# Patient Record
Sex: Female | Born: 1965 | Hispanic: No | Smoking: Never smoker
Health system: Southern US, Community
[De-identification: ages and names within clinical notes are randomized; demographics above are authoritative.]

## PROBLEM LIST (undated history)

## (undated) DIAGNOSIS — I10 Essential (primary) hypertension: Secondary | ICD-10-CM

## (undated) DIAGNOSIS — L309 Dermatitis, unspecified: Secondary | ICD-10-CM

## (undated) DIAGNOSIS — S069X9A Unspecified intracranial injury with loss of consciousness of unspecified duration, initial encounter: Secondary | ICD-10-CM

## (undated) DIAGNOSIS — S069XAA Unspecified intracranial injury with loss of consciousness status unknown, initial encounter: Secondary | ICD-10-CM

## (undated) HISTORY — DX: Essential (primary) hypertension: I10

## (undated) HISTORY — DX: Dermatitis, unspecified: L30.9

## (undated) HISTORY — DX: Unspecified intracranial injury with loss of consciousness status unknown, initial encounter: S06.9XAA

## (undated) HISTORY — DX: Unspecified intracranial injury with loss of consciousness of unspecified duration, initial encounter: S06.9X9A

---

## 1985-12-02 HISTORY — PX: FOREARM SURGERY: SHX651

## 2005-01-21 ENCOUNTER — Other Ambulatory Visit: Admission: RE | Admit: 2005-01-21 | Discharge: 2005-01-21 | Payer: Self-pay | Admitting: Family Medicine

## 2005-04-22 ENCOUNTER — Encounter: Admission: RE | Admit: 2005-04-22 | Discharge: 2005-04-22 | Payer: Self-pay | Admitting: Family Medicine

## 2009-06-15 ENCOUNTER — Other Ambulatory Visit: Admission: RE | Admit: 2009-06-15 | Discharge: 2009-06-15 | Payer: Self-pay | Admitting: Family Medicine

## 2010-08-17 ENCOUNTER — Other Ambulatory Visit: Admission: RE | Admit: 2010-08-17 | Discharge: 2010-08-17 | Payer: Self-pay | Admitting: Family Medicine

## 2010-11-29 ENCOUNTER — Encounter
Admission: RE | Admit: 2010-11-29 | Discharge: 2010-11-29 | Payer: Self-pay | Source: Home / Self Care | Attending: Family Medicine | Admitting: Family Medicine

## 2010-12-23 ENCOUNTER — Encounter: Payer: Self-pay | Admitting: Family Medicine

## 2010-12-24 ENCOUNTER — Encounter: Payer: Self-pay | Admitting: Family Medicine

## 2012-06-08 ENCOUNTER — Other Ambulatory Visit: Payer: Self-pay | Admitting: Family Medicine

## 2012-06-08 ENCOUNTER — Other Ambulatory Visit (HOSPITAL_COMMUNITY)
Admission: RE | Admit: 2012-06-08 | Discharge: 2012-06-08 | Disposition: A | Discharge status: Home / Self Care | Payer: BC Managed Care – PPO | Source: Ambulatory Visit | Attending: Family Medicine | Admitting: Family Medicine

## 2012-06-08 DIAGNOSIS — Z124 Encounter for screening for malignant neoplasm of cervix: Secondary | ICD-10-CM | POA: Insufficient documentation

## 2013-01-29 ENCOUNTER — Other Ambulatory Visit: Payer: Self-pay

## 2013-01-29 DIAGNOSIS — Z1231 Encounter for screening mammogram for malignant neoplasm of breast: Secondary | ICD-10-CM

## 2013-03-19 ENCOUNTER — Ambulatory Visit
Admission: RE | Admit: 2013-03-19 | Discharge: 2013-03-19 | Disposition: A | Discharge status: Home / Self Care | Payer: BC Managed Care – PPO | Source: Ambulatory Visit

## 2013-03-19 DIAGNOSIS — Z1231 Encounter for screening mammogram for malignant neoplasm of breast: Secondary | ICD-10-CM

## 2014-04-11 IMAGING — MG MM DIGITAL SCREENING BILAT
4 series · 4 of 4 positions shown · non-contrast
Comparison: 11/29/2010

CLINICAL DATA: Screening.

DIGITAL BILATERAL SCREENING MAMMOGRAM WITH CAD

[L CC]
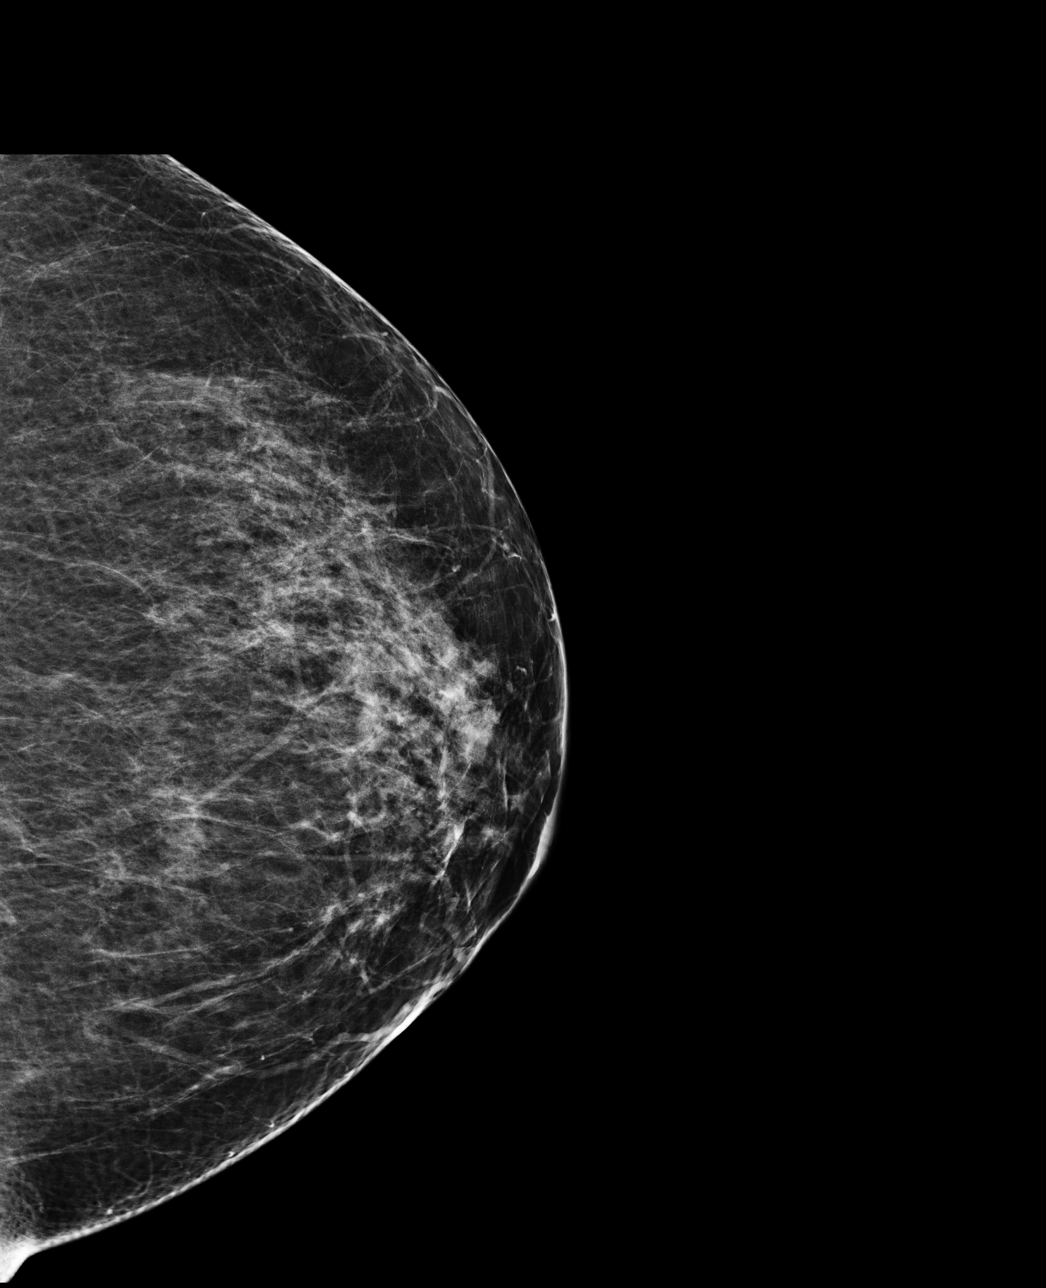

[L MLO]
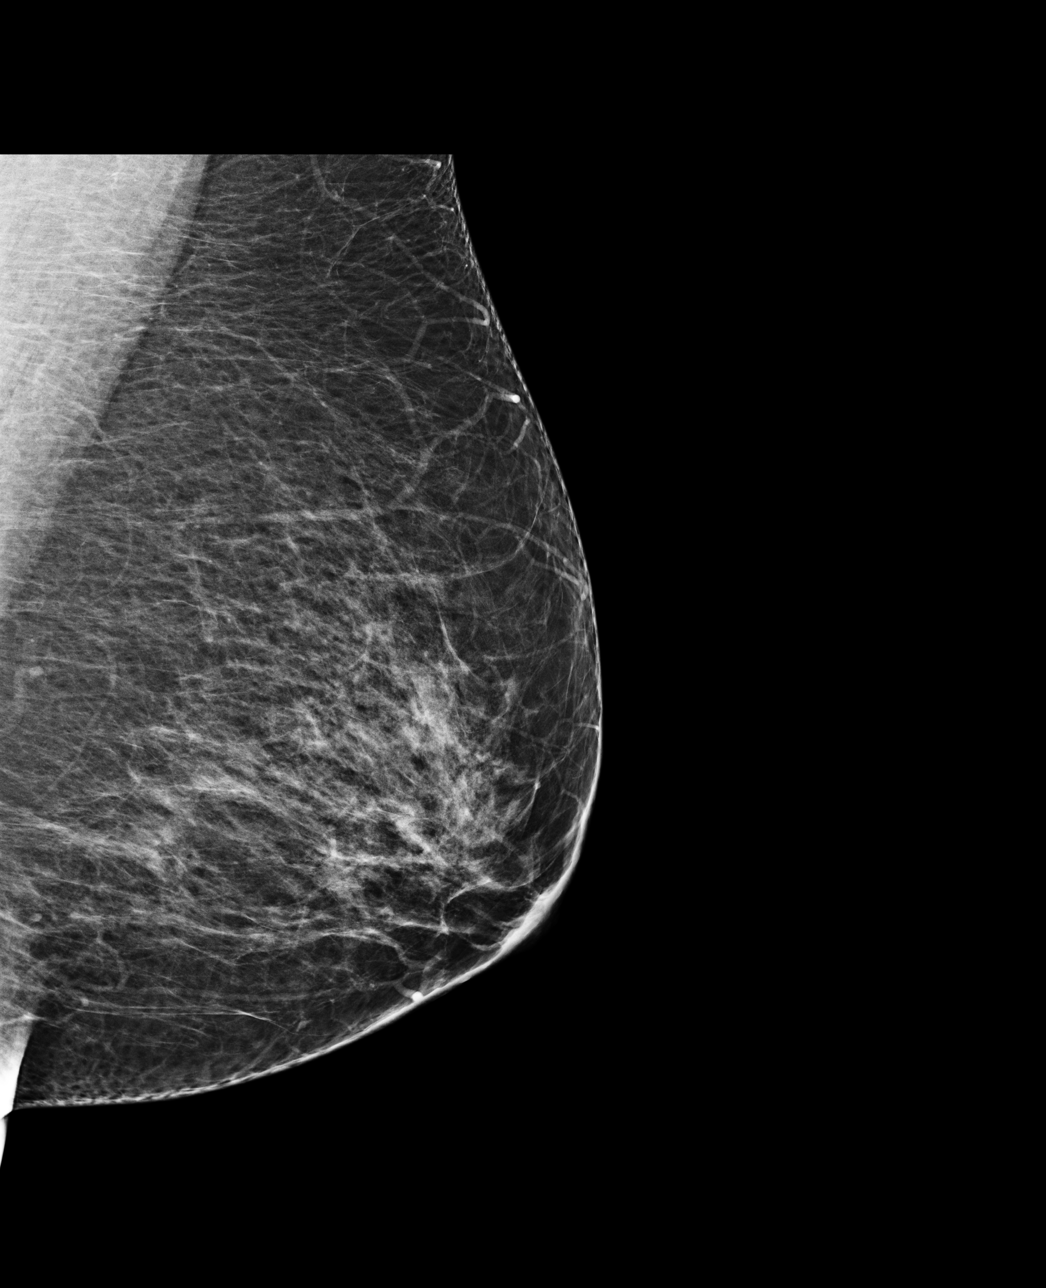

[R MLO]
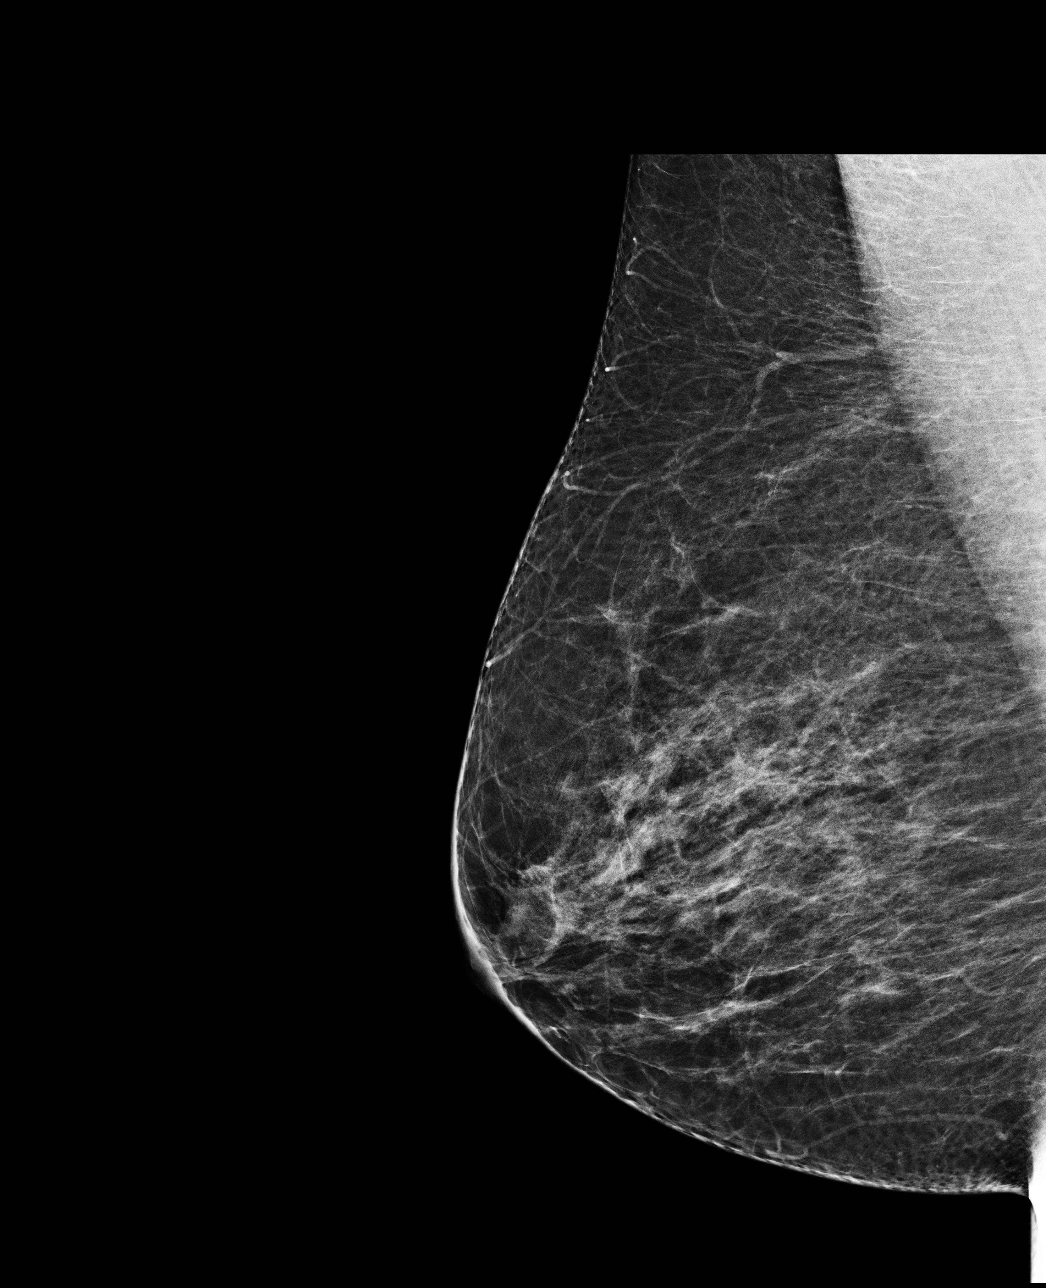

[R CC]
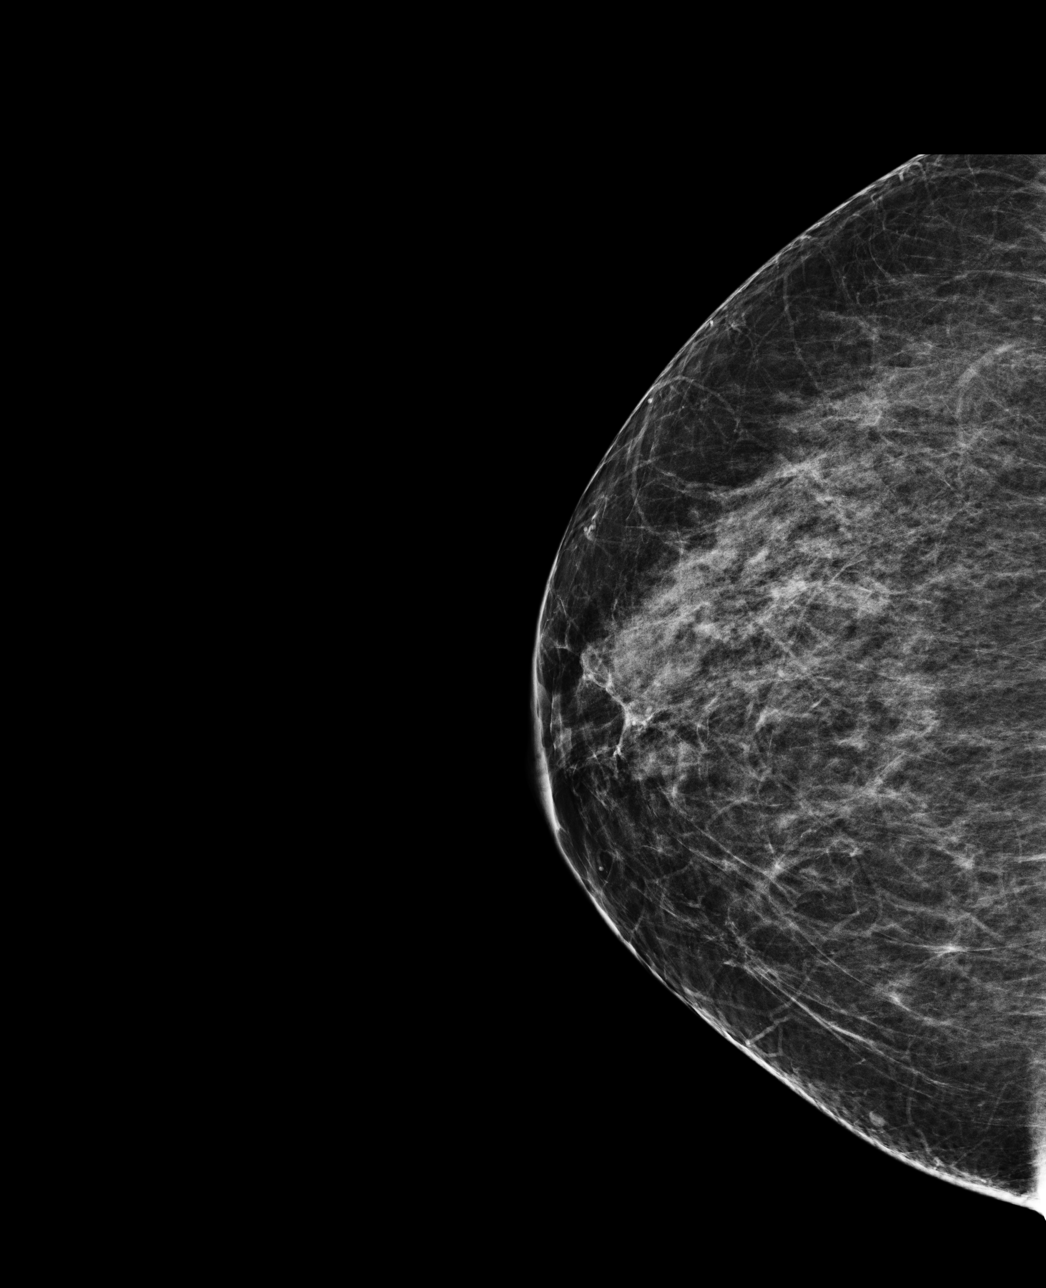

[4 of 4 positions shown; findings below may reference images not displayed]

FINDINGS: ACR Breast Density Category 2: There are scattered fibroglandular
densities.

There is no suspicious dominant mass, architectural distortion, or
calcification to suggest malignancy.

Images were processed with CAD.
IMPRESSION: No mammographic evidence of malignancy.

A result letter of this screening mammogram will be mailed directly
to the patient.

RECOMMENDATION:
Screening mammogram in one year. (Code:7E-D-LSW)

BI-RADS CATEGORY 1:  Negative.

## 2014-06-07 ENCOUNTER — Other Ambulatory Visit: Payer: Self-pay

## 2014-06-07 DIAGNOSIS — Z1231 Encounter for screening mammogram for malignant neoplasm of breast: Secondary | ICD-10-CM

## 2014-06-21 ENCOUNTER — Ambulatory Visit: Payer: BC Managed Care – PPO

## 2014-07-01 ENCOUNTER — Ambulatory Visit: Payer: BC Managed Care – PPO

## 2015-06-22 ENCOUNTER — Ambulatory Visit: Payer: BC Managed Care – PPO

## 2015-06-23 ENCOUNTER — Ambulatory Visit: Payer: BC Managed Care – PPO

## 2015-06-28 ENCOUNTER — Ambulatory Visit
Admission: RE | Admit: 2015-06-28 | Discharge: 2015-06-28 | Disposition: A | Discharge status: Home / Self Care | Payer: BC Managed Care – PPO | Source: Ambulatory Visit | Attending: Family Medicine | Admitting: Family Medicine

## 2015-06-28 ENCOUNTER — Other Ambulatory Visit (HOSPITAL_COMMUNITY)
Admission: RE | Admit: 2015-06-28 | Discharge: 2015-06-28 | Disposition: A | Discharge status: Home / Self Care | Payer: BC Managed Care – PPO | Source: Ambulatory Visit | Attending: Family Medicine | Admitting: Family Medicine

## 2015-06-28 ENCOUNTER — Other Ambulatory Visit: Payer: Self-pay | Admitting: Family Medicine

## 2015-06-28 DIAGNOSIS — R609 Edema, unspecified: Secondary | ICD-10-CM

## 2015-06-28 DIAGNOSIS — R52 Pain, unspecified: Secondary | ICD-10-CM

## 2015-06-28 DIAGNOSIS — Z01411 Encounter for gynecological examination (general) (routine) with abnormal findings: Secondary | ICD-10-CM | POA: Insufficient documentation

## 2015-06-29 LAB — CYTOLOGY - PAP

## 2015-07-13 ENCOUNTER — Ambulatory Visit: Payer: BC Managed Care – PPO

## 2015-07-14 ENCOUNTER — Ambulatory Visit: Payer: BC Managed Care – PPO

## 2015-07-20 ENCOUNTER — Ambulatory Visit: Payer: BC Managed Care – PPO

## 2015-11-23 ENCOUNTER — Other Ambulatory Visit: Payer: Self-pay

## 2015-11-23 DIAGNOSIS — Z1231 Encounter for screening mammogram for malignant neoplasm of breast: Secondary | ICD-10-CM

## 2015-12-15 ENCOUNTER — Ambulatory Visit: Payer: BC Managed Care – PPO

## 2016-04-03 ENCOUNTER — Ambulatory Visit
Admission: RE | Admit: 2016-04-03 | Discharge: 2016-04-03 | Disposition: A | Discharge status: Home / Self Care | Payer: BC Managed Care – PPO | Source: Ambulatory Visit | Attending: Family Medicine | Admitting: Family Medicine

## 2016-04-03 ENCOUNTER — Other Ambulatory Visit: Payer: Self-pay | Admitting: Family Medicine

## 2016-04-03 DIAGNOSIS — M79671 Pain in right foot: Secondary | ICD-10-CM

## 2016-06-26 ENCOUNTER — Ambulatory Visit: Payer: BC Managed Care – PPO

## 2016-06-27 ENCOUNTER — Ambulatory Visit: Payer: BC Managed Care – PPO

## 2016-06-28 ENCOUNTER — Ambulatory Visit: Payer: BC Managed Care – PPO

## 2016-07-02 ENCOUNTER — Ambulatory Visit: Payer: BC Managed Care – PPO

## 2016-07-20 IMAGING — CR DG ANKLE COMPLETE 3+V*L*
3 series · 3 of 3 positions shown · non-contrast
Comparison: None.

CLINICAL DATA: Left lateral ankle pain and swelling for 1 year. No
known injury. Often twisted the ankle.

EXAM:
LEFT ANKLE COMPLETE - 3+ VIEW

[view not recorded (1 of 3)]
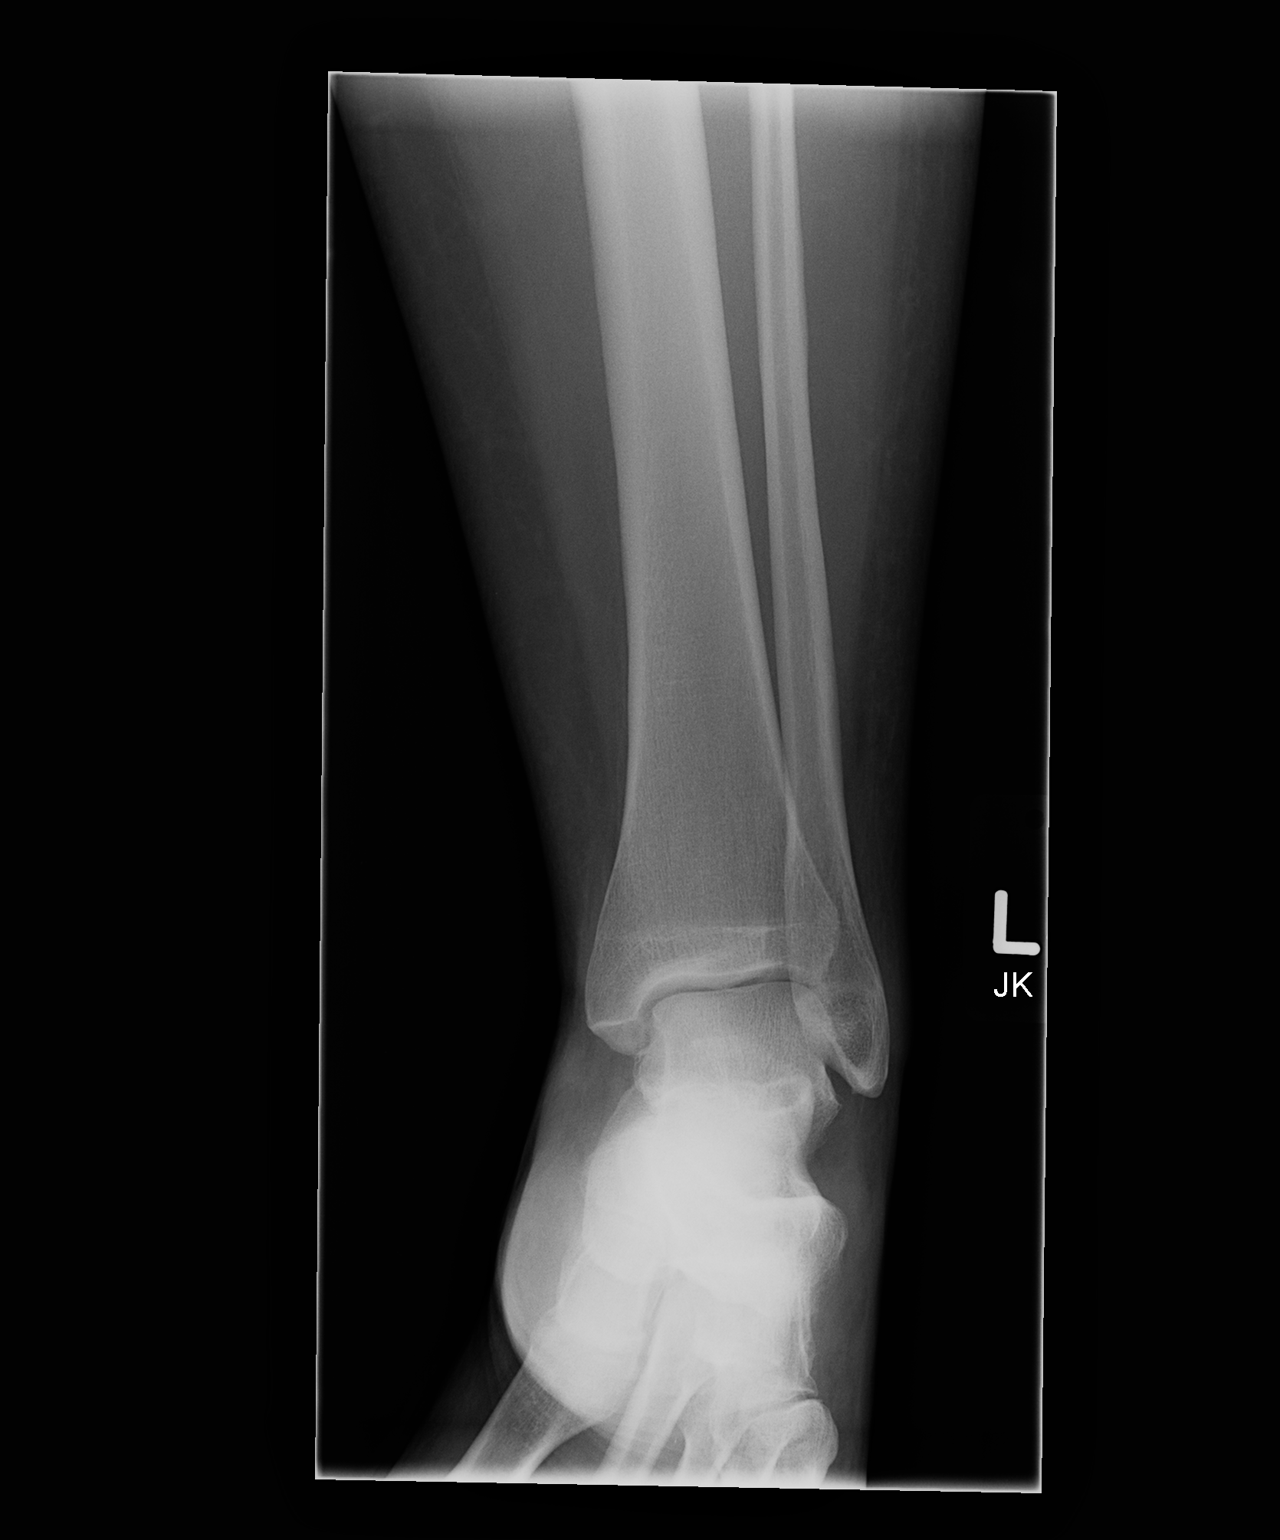

[view not recorded (2 of 3)]
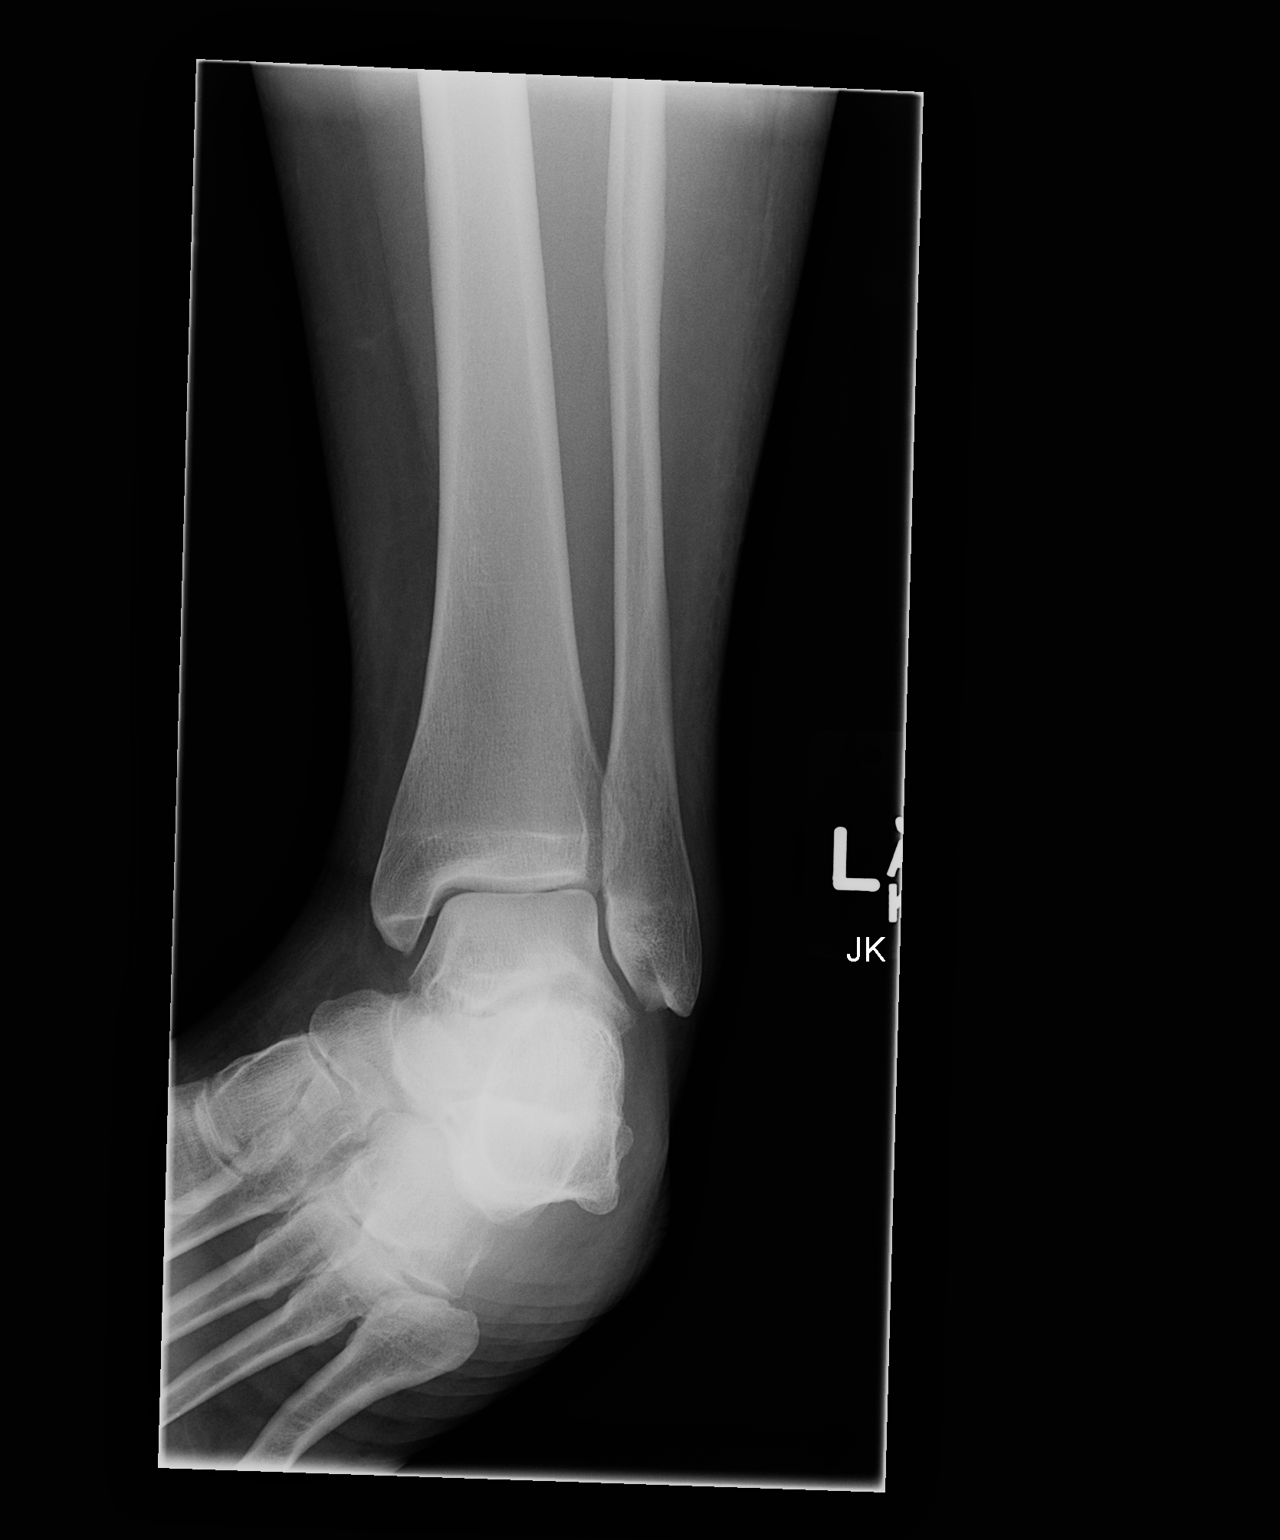

[view not recorded (3 of 3)]
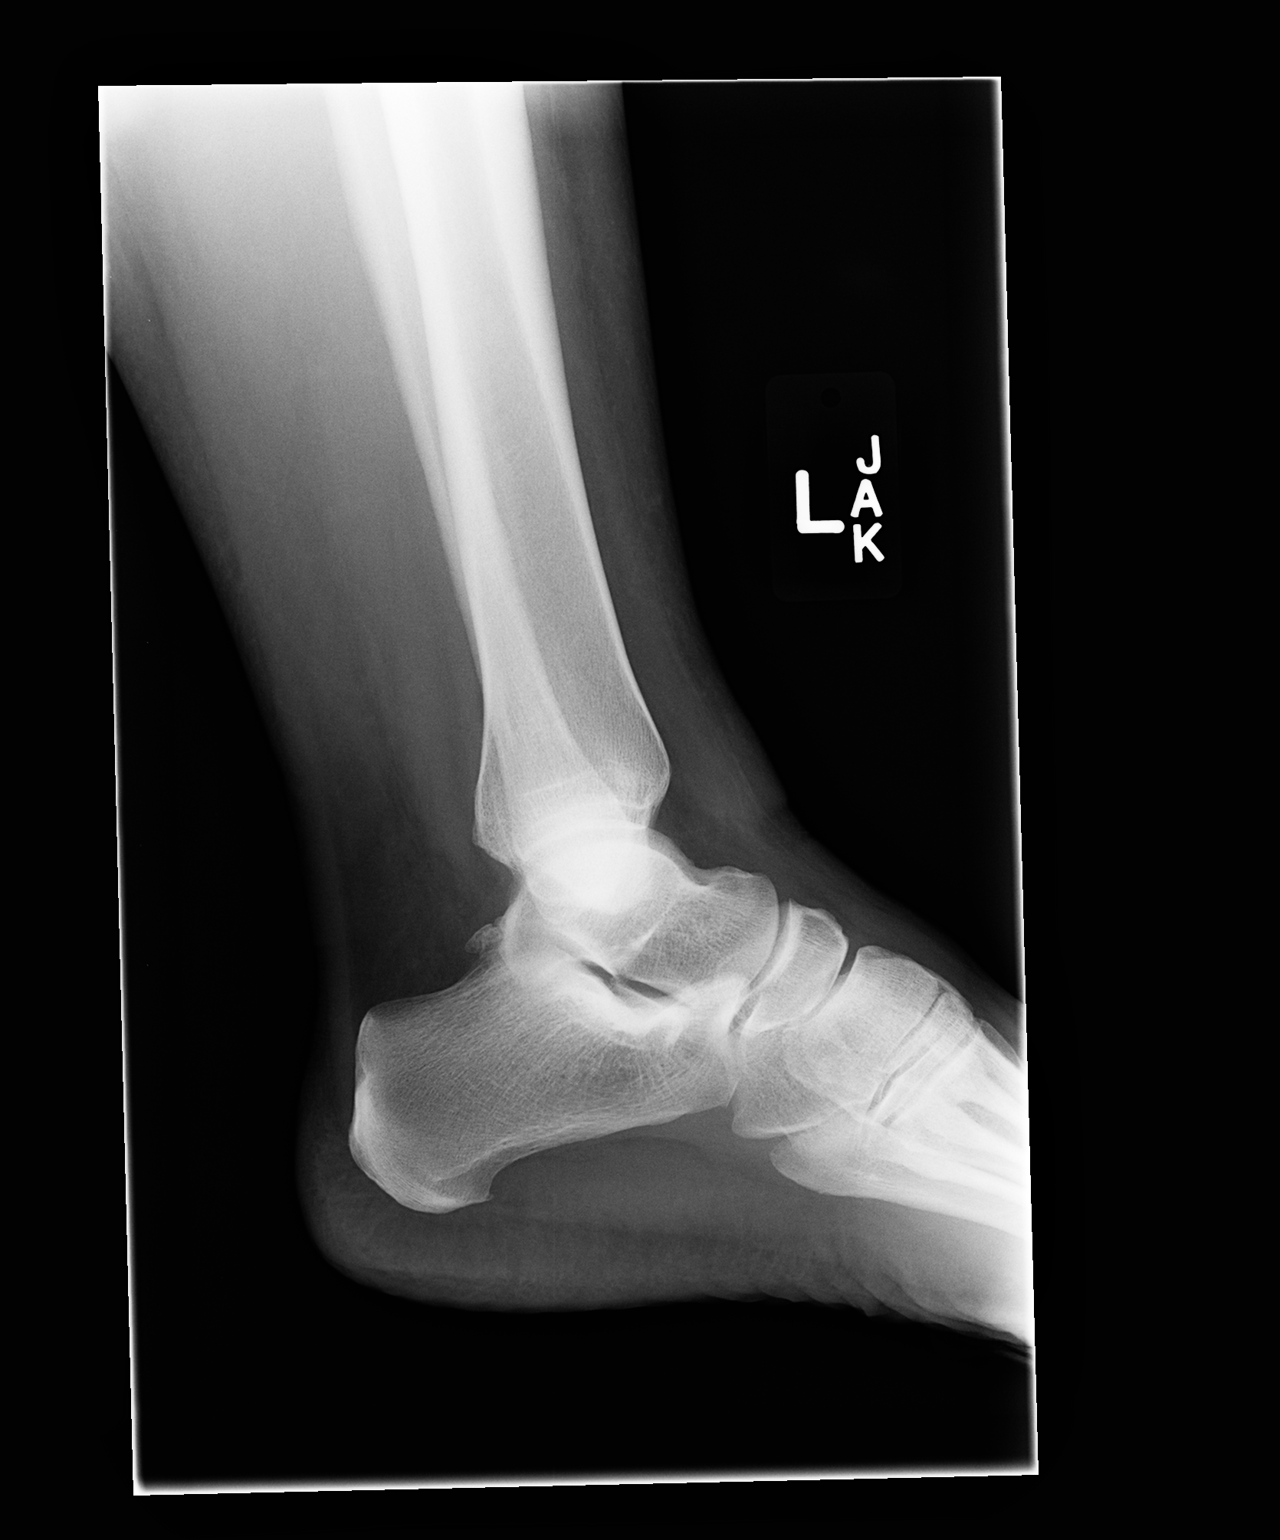

[3 of 3 positions shown; findings below may reference images not displayed]

FINDINGS: There is no evidence of fracture, dislocation, or joint effusion.
There is no evidence of arthropathy or other focal bone abnormality.
Soft tissues are unremarkable.
IMPRESSION: Negative.

## 2016-07-26 ENCOUNTER — Ambulatory Visit: Payer: BC Managed Care – PPO

## 2016-08-26 ENCOUNTER — Other Ambulatory Visit: Payer: Self-pay | Admitting: Family Medicine

## 2016-08-26 DIAGNOSIS — Z1231 Encounter for screening mammogram for malignant neoplasm of breast: Secondary | ICD-10-CM

## 2016-08-27 ENCOUNTER — Ambulatory Visit: Payer: BC Managed Care – PPO

## 2016-09-16 ENCOUNTER — Ambulatory Visit: Payer: BC Managed Care – PPO

## 2016-10-11 ENCOUNTER — Ambulatory Visit: Payer: BC Managed Care – PPO

## 2016-11-18 ENCOUNTER — Ambulatory Visit: Payer: BC Managed Care – PPO

## 2017-07-23 ENCOUNTER — Other Ambulatory Visit: Payer: Self-pay | Admitting: Family Medicine

## 2017-07-23 DIAGNOSIS — Z1231 Encounter for screening mammogram for malignant neoplasm of breast: Secondary | ICD-10-CM

## 2017-08-01 ENCOUNTER — Ambulatory Visit: Payer: BC Managed Care – PPO

## 2018-10-12 ENCOUNTER — Ambulatory Visit: Payer: Self-pay | Admitting: Psychiatry

## 2018-11-26 ENCOUNTER — Other Ambulatory Visit: Payer: Self-pay | Admitting: Family Medicine

## 2018-11-26 ENCOUNTER — Other Ambulatory Visit (HOSPITAL_COMMUNITY)
Admission: RE | Admit: 2018-11-26 | Discharge: 2018-11-26 | Disposition: A | Discharge status: Home / Self Care | Payer: BC Managed Care – PPO | Source: Ambulatory Visit | Attending: Family Medicine | Admitting: Family Medicine

## 2018-11-26 DIAGNOSIS — Z01411 Encounter for gynecological examination (general) (routine) with abnormal findings: Secondary | ICD-10-CM | POA: Insufficient documentation

## 2018-12-01 LAB — CYTOLOGY - PAP: Diagnosis: NEGATIVE

## 2020-04-24 ENCOUNTER — Ambulatory Visit: Payer: Self-pay | Admitting: Psychiatry

## 2020-05-19 ENCOUNTER — Encounter (INDEPENDENT_AMBULATORY_CARE_PROVIDER_SITE_OTHER): Payer: Self-pay

## 2020-05-19 ENCOUNTER — Other Ambulatory Visit: Payer: Self-pay

## 2020-05-19 ENCOUNTER — Ambulatory Visit (INDEPENDENT_AMBULATORY_CARE_PROVIDER_SITE_OTHER): Payer: BC Managed Care – PPO | Admitting: Psychiatry

## 2020-05-19 ENCOUNTER — Encounter: Payer: Self-pay | Admitting: Psychiatry

## 2020-05-19 VITALS — BP 127/81 | HR 76

## 2020-05-19 DIAGNOSIS — F325 Major depressive disorder, single episode, in full remission: Secondary | ICD-10-CM | POA: Diagnosis not present

## 2020-05-19 DIAGNOSIS — F4310 Post-traumatic stress disorder, unspecified: Secondary | ICD-10-CM | POA: Diagnosis not present
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
## 2021-03-02 ENCOUNTER — Other Ambulatory Visit: Payer: Self-pay | Admitting: Family Medicine

## 2021-03-02 DIAGNOSIS — Z1231 Encounter for screening mammogram for malignant neoplasm of breast: Secondary | ICD-10-CM

## 2021-04-24 ENCOUNTER — Ambulatory Visit: Payer: BC Managed Care – PPO

## 2021-05-17 ENCOUNTER — Ambulatory Visit: Payer: BC Managed Care – PPO

## 2021-05-21 ENCOUNTER — Ambulatory Visit
Admission: RE | Admit: 2021-05-21 | Discharge: 2021-05-21 | Disposition: A | Discharge status: Home / Self Care | Payer: BC Managed Care – PPO | Source: Ambulatory Visit | Attending: Family Medicine | Admitting: Family Medicine

## 2021-05-21 ENCOUNTER — Other Ambulatory Visit: Payer: Self-pay

## 2021-05-21 DIAGNOSIS — Z1231 Encounter for screening mammogram for malignant neoplasm of breast: Secondary | ICD-10-CM

## 2021-07-23 ENCOUNTER — Other Ambulatory Visit (HOSPITAL_BASED_OUTPATIENT_CLINIC_OR_DEPARTMENT_OTHER): Payer: Self-pay | Admitting: Family Medicine

## 2021-07-23 DIAGNOSIS — M7989 Other specified soft tissue disorders: Secondary | ICD-10-CM

## 2021-07-24 ENCOUNTER — Ambulatory Visit (HOSPITAL_BASED_OUTPATIENT_CLINIC_OR_DEPARTMENT_OTHER)
Admission: RE | Admit: 2021-07-24 | Discharge: 2021-07-24 | Disposition: A | Discharge status: Home / Self Care | Payer: BC Managed Care – PPO | Source: Ambulatory Visit | Attending: Family Medicine | Admitting: Family Medicine

## 2021-07-24 ENCOUNTER — Other Ambulatory Visit: Payer: Self-pay

## 2021-07-24 DIAGNOSIS — M7989 Other specified soft tissue disorders: Secondary | ICD-10-CM | POA: Insufficient documentation

## 2021-08-22 ENCOUNTER — Other Ambulatory Visit: Payer: Self-pay

## 2021-08-22 ENCOUNTER — Telehealth: Payer: Self-pay

## 2021-08-22 DIAGNOSIS — I10 Essential (primary) hypertension: Secondary | ICD-10-CM

## 2021-08-22 DIAGNOSIS — S069X9A Unspecified intracranial injury with loss of consciousness of unspecified duration, initial encounter: Secondary | ICD-10-CM | POA: Insufficient documentation

## 2021-08-22 DIAGNOSIS — L309 Dermatitis, unspecified: Secondary | ICD-10-CM | POA: Insufficient documentation

## 2021-08-22 DIAGNOSIS — S069XAA Unspecified intracranial injury with loss of consciousness status unknown, initial encounter: Secondary | ICD-10-CM | POA: Insufficient documentation

## 2021-08-22 HISTORY — DX: Essential (primary) hypertension: I10

## 2021-08-22 NOTE — Telephone Encounter (Signed)
I reach out to Dr. Chanetta Marshall office, LM with medical records to fax referral information.

## 2021-08-24 ENCOUNTER — Ambulatory Visit (INDEPENDENT_AMBULATORY_CARE_PROVIDER_SITE_OTHER): Payer: BC Managed Care – PPO

## 2021-08-24 ENCOUNTER — Ambulatory Visit: Payer: BC Managed Care – PPO | Admitting: Cardiology

## 2021-08-24 ENCOUNTER — Other Ambulatory Visit: Payer: Self-pay

## 2021-08-24 ENCOUNTER — Encounter: Payer: Self-pay | Admitting: Cardiology

## 2021-08-24 VITALS — BP 112/80 | HR 106 | Ht 66.5 in | Wt 171.0 lb

## 2021-08-24 DIAGNOSIS — R0600 Dyspnea, unspecified: Secondary | ICD-10-CM | POA: Diagnosis not present

## 2021-08-24 DIAGNOSIS — R002 Palpitations: Secondary | ICD-10-CM

## 2021-08-24 DIAGNOSIS — R0609 Other forms of dyspnea: Secondary | ICD-10-CM

## 2021-08-24 DIAGNOSIS — I10 Essential (primary) hypertension: Secondary | ICD-10-CM

## 2021-08-24 HISTORY — DX: Palpitations: R00.2

## 2021-08-24 HISTORY — DX: Other forms of dyspnea: R06.09

## 2021-08-24 NOTE — Patient Instructions (Signed)
Medication Instructions:  Your physician recommends that you continue on your current medications as directed. Please refer to the Current Medication list given to you today.  *If you need a refill on your cardiac medications before your next appointment, please call your pharmacy*   Lab Work: None If you have labs (blood work) drawn today and your tests are completely normal, you will receive your results only by: MyChart Message (if you have MyChart) OR A paper copy in the mail If you have any lab test that is abnormal or we need to change your treatment, we will call you to review the results.   Testing/Procedures: Your physician has requested that you have an echocardiogram. Echocardiography is a painless test that uses sound waves to create images of your heart. It provides your doctor with information about the size and shape of your heart and how well your heart's chambers and valves are working. This procedure takes approximately one hour. There are no restrictions for this procedure.  A zio monitor was ordered today. It will remain on for 14 days. You will then return monitor and event diary in provided box. It takes 1-2 weeks for report to be downloaded and returned to us. We will call you with the results. If monitor falls off or has orange flashing light, please call Zio for further instructions.     Follow-Up: At [facility], you and your health needs are our priority.  As part of our continuing mission to provide you with exceptional heart care, we have created designated Provider Care Teams.  These Care Teams include your primary Cardiologist (physician) and Advanced Practice Providers (APPs -  Physician Assistants and Nurse Practitioners) who all work together to provide you with the care you need, when you need it.  We recommend signing up for the patient portal called "MyChart".  Sign up information is provided on this After Visit Summary.  MyChart is used to connect with  patients for Virtual Visits (Telemedicine).  Patients are able to view lab/test results, encounter notes, upcoming appointments, etc.  Non-urgent messages can be sent to your provider as well.   To learn more about what you can do with MyChart, go to [email or web address].    Your next appointment:   2 month(s)  The format for your next appointment:   In Person  Provider:   Robert Krasowski, MD    Other Instructions   
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
## 2021-08-24 NOTE — Addendum Note (Signed)
Addended by: Delorse Limber I on: 08/24/2021 02:58 PM   Modules accepted: Orders

## 2021-09-07 ENCOUNTER — Other Ambulatory Visit: Payer: BC Managed Care – PPO

## 2021-09-19 ENCOUNTER — Other Ambulatory Visit: Payer: Self-pay

## 2021-09-19 ENCOUNTER — Ambulatory Visit (INDEPENDENT_AMBULATORY_CARE_PROVIDER_SITE_OTHER): Payer: BC Managed Care – PPO

## 2021-09-19 DIAGNOSIS — R0609 Other forms of dyspnea: Secondary | ICD-10-CM | POA: Diagnosis not present

## 2021-09-19 LAB — ECHOCARDIOGRAM COMPLETE
Area-P 1/2: 3.4 cm2
P 1/2 time: 435 msec
S' Lateral: 3.2 cm

## 2021-09-24 ENCOUNTER — Other Ambulatory Visit: Payer: Self-pay

## 2021-09-24 ENCOUNTER — Telehealth: Payer: Self-pay | Admitting: Cardiology

## 2021-09-24 NOTE — Telephone Encounter (Signed)
Patient calling back. She states she spoke with Tiffany earlier today and thinks she may have given her wrong information. She would like a call back to amend her message. She says to call: [phone], but if she does not answer to try: [phone]

## 2021-09-24 NOTE — Telephone Encounter (Signed)
No change needed in previous message.

## 2021-10-01 ENCOUNTER — Telehealth: Payer: Self-pay | Admitting: Emergency Medicine

## 2021-10-01 NOTE — Telephone Encounter (Signed)
-----   Message from Georgeanna Lea, MD sent at 09/28/2021  4:44 PM EDT ----- Start metoprolol succinate 25 mg daily ----- Message ----- From: Hazle Quant, RN Sent: 09/26/2021  11:11 AM EDT To: Georgeanna Lea, MD  Please advise see Tiffany's note below.

## 2021-10-01 NOTE — Telephone Encounter (Signed)
Left message for patient to return call.

## 2021-10-02 ENCOUNTER — Telehealth: Payer: Self-pay | Admitting: Cardiology

## 2021-10-02 MED ORDER — METOPROLOL SUCCINATE ER 25 MG PO TB24: 25.0000 mg | ORAL_TABLET | Freq: Every day | ORAL | 3 refills | Status: DC

## 2021-10-02 NOTE — Telephone Encounter (Signed)
Spoke with patient regarding results and recommendation.  Patient verbalizes understanding and is agreeable to plan of care. Advised patient to call back with any issues or concerns.  

## 2021-10-02 NOTE — Telephone Encounter (Signed)
Follow Up:     Patient is returning Hayley's call from today. 

## 2021-10-05 ENCOUNTER — Ambulatory Visit: Payer: BC Managed Care – PPO | Admitting: Cardiology

## 2021-10-09 ENCOUNTER — Telehealth: Payer: Self-pay | Admitting: Cardiology

## 2021-10-09 ENCOUNTER — Other Ambulatory Visit: Payer: Self-pay | Admitting: Family Medicine

## 2021-10-09 ENCOUNTER — Other Ambulatory Visit (HOSPITAL_COMMUNITY)
Admission: RE | Admit: 2021-10-09 | Discharge: 2021-10-09 | Disposition: A | Discharge status: Home / Self Care | Payer: BC Managed Care – PPO | Source: Ambulatory Visit | Attending: Family Medicine | Admitting: Family Medicine

## 2021-10-09 DIAGNOSIS — Z01411 Encounter for gynecological examination (general) (routine) with abnormal findings: Secondary | ICD-10-CM | POA: Insufficient documentation

## 2021-10-09 NOTE — Telephone Encounter (Signed)
Patient would like a copy of what the findings to her Echo was sent to her.  What the message was that was left for her, sent to her at [address], [place], [place] [address].

## 2021-10-11 LAB — CYTOLOGY - PAP
Adequacy: ABSENT
Comment: NEGATIVE
Diagnosis: NEGATIVE
High risk HPV: NEGATIVE

## 2021-11-02 DIAGNOSIS — F39 Unspecified mood [affective] disorder: Secondary | ICD-10-CM

## 2021-11-02 DIAGNOSIS — F411 Generalized anxiety disorder: Secondary | ICD-10-CM

## 2021-11-02 DIAGNOSIS — E669 Obesity, unspecified: Secondary | ICD-10-CM

## 2021-11-02 DIAGNOSIS — N3946 Mixed incontinence: Secondary | ICD-10-CM

## 2021-11-02 DIAGNOSIS — J4530 Mild persistent asthma, uncomplicated: Secondary | ICD-10-CM | POA: Insufficient documentation

## 2021-11-02 DIAGNOSIS — H9190 Unspecified hearing loss, unspecified ear: Secondary | ICD-10-CM

## 2021-11-02 DIAGNOSIS — E559 Vitamin D deficiency, unspecified: Secondary | ICD-10-CM

## 2021-11-02 DIAGNOSIS — I471 Supraventricular tachycardia, unspecified: Secondary | ICD-10-CM

## 2021-11-02 DIAGNOSIS — K219 Gastro-esophageal reflux disease without esophagitis: Secondary | ICD-10-CM

## 2021-11-02 DIAGNOSIS — F329 Major depressive disorder, single episode, unspecified: Secondary | ICD-10-CM | POA: Insufficient documentation

## 2021-11-02 HISTORY — DX: Generalized anxiety disorder: F41.1

## 2021-11-02 HISTORY — DX: Unspecified mood (affective) disorder: F39

## 2021-11-02 HISTORY — DX: Obesity, unspecified: E66.9

## 2021-11-02 HISTORY — DX: Mild persistent asthma, uncomplicated: J45.30

## 2021-11-02 HISTORY — DX: Supraventricular tachycardia: I47.1

## 2021-11-02 HISTORY — DX: Mixed incontinence: N39.46

## 2021-11-02 HISTORY — DX: Unspecified hearing loss, unspecified ear: H91.90

## 2021-11-02 HISTORY — DX: Supraventricular tachycardia, unspecified: I47.10

## 2021-11-02 HISTORY — DX: Major depressive disorder, single episode, unspecified: F32.9

## 2021-11-02 HISTORY — DX: Vitamin D deficiency, unspecified: E55.9

## 2021-11-02 HISTORY — DX: Gastro-esophageal reflux disease without esophagitis: K21.9

## 2021-11-05 ENCOUNTER — Encounter: Payer: Self-pay | Admitting: Cardiology

## 2021-11-05 ENCOUNTER — Ambulatory Visit: Payer: BC Managed Care – PPO | Admitting: Cardiology

## 2021-11-05 ENCOUNTER — Other Ambulatory Visit: Payer: Self-pay

## 2021-11-05 VITALS — BP 124/86 | HR 76 | Ht 66.5 in | Wt 176.8 lb

## 2021-11-05 DIAGNOSIS — J4530 Mild persistent asthma, uncomplicated: Secondary | ICD-10-CM | POA: Diagnosis not present

## 2021-11-05 DIAGNOSIS — R002 Palpitations: Secondary | ICD-10-CM | POA: Diagnosis not present

## 2021-11-05 DIAGNOSIS — F411 Generalized anxiety disorder: Secondary | ICD-10-CM

## 2021-11-05 DIAGNOSIS — I10 Essential (primary) hypertension: Secondary | ICD-10-CM

## 2021-11-05 DIAGNOSIS — I471 Supraventricular tachycardia: Secondary | ICD-10-CM
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
## 2021-11-05 NOTE — Patient Instructions (Signed)
Medication Instructions:  Your physician has recommended you make the following change in your medication: START THE METOPROLOL 25MG  ONCE DAILY  *If you need a refill on your cardiac medications before your next appointment, please call your pharmacy*   Lab Work: NONE If you have labs (blood work) drawn today and your tests are completely normal, you will receive your results only by: MyChart Message (if you have MyChart) OR A paper copy in the mail If you have any lab test that is abnormal or we need to change your treatment, we will call you to review the results.   Testing/Procedures: NONE   Follow-Up: At [facility], you and your health needs are our priority.  As part of our continuing mission to provide you with exceptional heart care, we have created designated Provider Care Teams.  These Care Teams include your primary Cardiologist (physician) and Advanced Practice Providers (APPs -  Physician Assistants and Nurse Practitioners) who all work together to provide you with the care you need, when you need it.  We recommend signing up for the patient portal called "MyChart".  Sign up information is provided on this After Visit Summary.  MyChart is used to connect with patients for Virtual Visits (Telemedicine).  Patients are able to view lab/test results, encounter notes, upcoming appointments, etc.  Non-urgent messages can be sent to your provider as well.   To learn more about what you can do with MyChart, go to [facility].    Your next appointment:   3 month(s)  The format for your next appointment:   In Person  Provider:   [email or web address], MD    Other Instructions

## 2021-11-06 ENCOUNTER — Telehealth: Payer: Self-pay | Admitting: Cardiology

## 2021-11-06 NOTE — Telephone Encounter (Signed)
Spoke to patient she wants [place] to clear her for a colonoscopy. Informed her we will need a clearance form. She understood she will call office and have them send to [place]. No further questions.

## 2021-11-06 NOTE — Telephone Encounter (Signed)
Patient aware she does not need a clearance. She is aware Dr. Bing Matter said her heart medications are fine as well if she has colonoscopy.

## 2021-11-06 NOTE — Telephone Encounter (Signed)
   Pt would like to ask Dr. Kirtland Bouchard if its ok for her to get coloscopy tomorrow, if the meds she will be taking is ok and will not going to affect her heart meds and her heart

## 2021-11-06 NOTE — Telephone Encounter (Signed)
  Nicole Brewer with [facility] calling, she said they don't need a clearance, pt doesn't take anything that will effect pt's colonoscopy tomorrow. She said if there's any questions to call her back at [phone]

## 2021-12-24 ENCOUNTER — Other Ambulatory Visit: Payer: Self-pay

## 2021-12-24 ENCOUNTER — Ambulatory Visit: Payer: BC Managed Care – PPO | Attending: Family Medicine

## 2021-12-24 DIAGNOSIS — M62838 Other muscle spasm: Secondary | ICD-10-CM | POA: Diagnosis not present

## 2021-12-24 DIAGNOSIS — M6281 Muscle weakness (generalized): Secondary | ICD-10-CM | POA: Insufficient documentation

## 2021-12-24 DIAGNOSIS — R279 Unspecified lack of coordination: Secondary | ICD-10-CM | POA: Insufficient documentation
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
## 2021-12-24 NOTE — Patient Instructions (Addendum)
Bladder/bowel retraining:  Drink 4-8oz an hour ONLY WATER Stop water intake 3 hours before bed Try to go at least 2-3 hours between trips to the bathroom - go whether you need to or not.  For two weeks.  Access Code: [email or web address] URL: [email or web address] Date: 12/24/2021 Prepared by: Julio Alm  Exercises Supine Pelvic Floor Contraction - 2 x daily - 7 x weekly - 2 sets - 10 reps Supine Diaphragmatic Breathing - 3-5 x daily - 7 x weekly - 1 sets - 10 reps

## 2022-01-02 ENCOUNTER — Ambulatory Visit: Payer: BC Managed Care – PPO | Attending: Family Medicine

## 2022-01-02 DIAGNOSIS — M62838 Other muscle spasm: Secondary | ICD-10-CM | POA: Insufficient documentation

## 2022-01-02 DIAGNOSIS — M6281 Muscle weakness (generalized): Secondary | ICD-10-CM | POA: Insufficient documentation

## 2022-01-02 DIAGNOSIS — R279 Unspecified lack of coordination: Secondary | ICD-10-CM | POA: Insufficient documentation

## 2022-01-02 NOTE — Patient Instructions (Signed)
Access Code: [email or web address] URL: [email or web address] Date: 01/02/2022 Prepared by: Julio Alm  Exercises Supine Pelvic Floor Contraction - 2 x daily - 7 x weekly - 2 sets - 10 reps Supine Diaphragmatic Breathing - 3-5 x daily - 7 x weekly - 1 sets - 10 reps Child's Pose Stretch - 1 x daily - 7 x weekly - 3 sets - 10 reps Cat Cow - 1 x daily - 7 x weekly - 3 sets - 10 reps Happy Baby with Pelvic Floor Lengthening - 1 x daily - 7 x weekly - 3 sets - 10 reps Supine Lower Trunk Rotation - 1 x daily - 7 x weekly - 3 sets - 10 reps Supine Posterior Pelvic Tilt - 1 x daily - 7 x weekly - 3 sets - 10 reps
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
## 2022-01-10 ENCOUNTER — Other Ambulatory Visit: Payer: Self-pay

## 2022-01-10 ENCOUNTER — Ambulatory Visit: Payer: BC Managed Care – PPO

## 2022-01-10 DIAGNOSIS — M62838 Other muscle spasm: Secondary | ICD-10-CM | POA: Diagnosis not present

## 2022-01-10 DIAGNOSIS — M6281 Muscle weakness (generalized): Secondary | ICD-10-CM

## 2022-01-10 DIAGNOSIS — R279 Unspecified lack of coordination: Secondary | ICD-10-CM

## 2022-01-10 NOTE — Patient Instructions (Signed)
Access Code: [email or web address] URL: [email or web address] Date: 01/10/2022 Prepared by: Heather Roberts  Exercises Supine Pelvic Floor Contraction - 3 x daily - 7 x weekly - 2 sets - 10 reps Supine Pelvic Floor Contraction - 3 x daily - 7 x weekly - 1 sets - 5 reps - 10 hold Child's Pose Stretch - 1 x daily - 7 x weekly - 3 sets - 10 reps Cat Cow - 1 x daily - 7 x weekly - 3 sets - 10 reps Happy Baby with Pelvic Floor Lengthening - 1 x daily - 7 x weekly - 3 sets - 10 reps Supine Lower Trunk Rotation - 1 x daily - 7 x weekly - 3 sets - 10 reps Supine Bridge with Mini Swiss Ball Between Knees - 1 x daily - 7 x weekly - 3 sets - 10 reps Clamshell - 1 x daily - 7 x weekly - 3 sets - 10 reps Supine Transversus Abdominis Bracing with Leg Extension - 1 x daily - 7 x weekly - 3 sets - 10 reps
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
## 2022-01-17 ENCOUNTER — Other Ambulatory Visit: Payer: Self-pay

## 2022-01-17 ENCOUNTER — Ambulatory Visit: Payer: BC Managed Care – PPO

## 2022-01-17 DIAGNOSIS — M6281 Muscle weakness (generalized): Secondary | ICD-10-CM

## 2022-01-17 DIAGNOSIS — M62838 Other muscle spasm: Secondary | ICD-10-CM

## 2022-01-17 DIAGNOSIS — R279 Unspecified lack of coordination: Secondary | ICD-10-CM
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
## 2022-01-23 ENCOUNTER — Ambulatory Visit: Payer: BC Managed Care – PPO

## 2022-01-23 ENCOUNTER — Other Ambulatory Visit: Payer: Self-pay

## 2022-01-23 DIAGNOSIS — M62838 Other muscle spasm: Secondary | ICD-10-CM

## 2022-01-23 DIAGNOSIS — M6281 Muscle weakness (generalized): Secondary | ICD-10-CM

## 2022-01-23 DIAGNOSIS — R279 Unspecified lack of coordination: Secondary | ICD-10-CM
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
## 2022-01-23 NOTE — Patient Instructions (Signed)
Access Code: [email or web address] URL: [email or web address] Date: 01/23/2022 Prepared by: Julio Alm  Exercises Supine Pelvic Floor Contraction - 3 x daily - 7 x weekly - 2 sets - 10 reps Supine Pelvic Floor Contraction - 3 x daily - 7 x weekly - 1 sets - 5 reps - 10 hold Child's Pose Stretch - 1 x daily - 7 x weekly - 1 sets - 10 reps Cat Cow - 1 x daily - 7 x weekly - 2 sets - 10 reps Happy Baby with Pelvic Floor Lengthening - 1 x daily - 7 x weekly - 1 sets - 10 reps Supine Lower Trunk Rotation - 1 x daily - 7 x weekly - 3 sets - 10 reps Supine Bridge with Mini Swiss Ball Between Knees - 1 x daily - 7 x weekly - 2 sets - 10 reps Supine Dead Bug with Leg Extension - 1 x daily - 7 x weekly - 2 sets - 10 reps Clamshell with Resistance - 1 x daily - 7 x weekly - 2 sets - 10 reps Standing Shoulder Row with Anchored Resistance - 1 x daily - 7 x weekly - 3 sets - 10 reps Shoulder extension with resistance - Neutral - 1 x daily - 7 x weekly - 3 sets - 10 reps Squat with Chair Touch - 1 x daily - 7 x weekly - 3 sets - 10 reps

## 2022-02-06 ENCOUNTER — Other Ambulatory Visit: Payer: Self-pay

## 2022-02-06 ENCOUNTER — Ambulatory Visit: Payer: BC Managed Care – PPO | Attending: Family Medicine

## 2022-02-06 DIAGNOSIS — R279 Unspecified lack of coordination: Secondary | ICD-10-CM | POA: Diagnosis present

## 2022-02-06 DIAGNOSIS — M6281 Muscle weakness (generalized): Secondary | ICD-10-CM | POA: Diagnosis present

## 2022-02-06 DIAGNOSIS — M62838 Other muscle spasm: Secondary | ICD-10-CM | POA: Insufficient documentation
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
## 2022-02-14 ENCOUNTER — Ambulatory Visit: Payer: BC Managed Care – PPO | Admitting: Cardiology

## 2022-02-27 ENCOUNTER — Ambulatory Visit: Payer: BC Managed Care – PPO

## 2022-02-27 DIAGNOSIS — M6281 Muscle weakness (generalized): Secondary | ICD-10-CM

## 2022-02-27 DIAGNOSIS — M62838 Other muscle spasm: Secondary | ICD-10-CM

## 2022-02-27 DIAGNOSIS — R279 Unspecified lack of coordination: Secondary | ICD-10-CM
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
## 2022-03-27 ENCOUNTER — Ambulatory Visit: Payer: BC Managed Care – PPO | Attending: Family Medicine

## 2022-03-27 DIAGNOSIS — R279 Unspecified lack of coordination: Secondary | ICD-10-CM | POA: Insufficient documentation

## 2022-03-27 DIAGNOSIS — M62838 Other muscle spasm: Secondary | ICD-10-CM | POA: Diagnosis present

## 2022-03-27 DIAGNOSIS — M6281 Muscle weakness (generalized): Secondary | ICD-10-CM | POA: Diagnosis present
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
## 2022-04-25 ENCOUNTER — Telehealth: Payer: Self-pay | Admitting: Cardiology

## 2022-04-25 NOTE — Telephone Encounter (Signed)
  Pt is requesting to switch from Dr. Agustin Cree to Dr. Harrell Gave. She moved to [place] and she is closer to our [place] office.

## 2022-04-26 NOTE — Telephone Encounter (Signed)
Ok by me

## 2022-05-01 ENCOUNTER — Ambulatory Visit: Payer: BC Managed Care – PPO | Admitting: Cardiology

## 2022-05-09 ENCOUNTER — Telehealth: Payer: Self-pay | Admitting: Cardiology

## 2022-05-09 NOTE — Telephone Encounter (Signed)
STAT if HR is under 50 or over 120 (normal HR is 60-100 beats per minute)  What is your heart rate? 130's  Do you have a log of your heart rate readings (document readings)? No   Do you have any other symptoms? Palpitations. Chest pain, vomiting

## 2022-05-09 NOTE — Telephone Encounter (Signed)
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 She says she returned home, but woke up and felt sickly. She could feel her heartbeat beating very readily. Patient thought she was having GERD and took Nexium.   RN recommended patient to be seen in the emergency department. Patient states that she lives in [place] and does not want to go to their ED. Patient is willing to go to the [facility] ED.   Patient has current appointment in July with Dr. Cristal Deer, advised her we could move it up if need be after being seen  in the Emergency Department.

## 2022-05-10 NOTE — Telephone Encounter (Signed)
Dr. Harrell Gave this patient has current 7/24 appointment with you, would you like to see her sooner?

## 2022-05-10 NOTE — Telephone Encounter (Signed)
Ok to move up, will also need to get the records from [facility] to review.

## 2022-05-10 NOTE — Telephone Encounter (Signed)
Patient is following up. She states she went to [facility] ED and they advised her to follow up with cardiology. Are we able to move up 7/24 appointment? Patient prefers to keep follow up with Dr. Cristal Brewer. Please advise.

## 2022-05-13 NOTE — Telephone Encounter (Signed)
Nicole Brewer, it looks like this patient will have to be added to a reader day I don't see any openings prior to her current 7/24 appointment. I do not know how to add to a reader day, so I am leaving this one in your basket for when you return.

## 2022-05-22 NOTE — Telephone Encounter (Signed)
Pt called back to give the correct fax number - [phone] [facility]

## 2022-05-22 NOTE — Telephone Encounter (Signed)
Patient is following up. She states [facility] needs [place] to request patient's ED notes in order for them to release them. She provided a fax number for [facility], [phone].

## 2022-05-22 NOTE — Telephone Encounter (Signed)
Will fax note through Epic EMR in hopes they will send since fax machine not currently working for outgoing faxes, can receive at [phone]

## 2022-05-22 NOTE — Telephone Encounter (Signed)
-  Appointment moved up to 6/27 with Gillian Shields, NP -Pt will contact [facility] to have records faxed for review.

## 2022-05-23 ENCOUNTER — Telehealth: Payer: Self-pay | Admitting: Cardiology

## 2022-05-23 NOTE — Telephone Encounter (Signed)
Spoke with patient and she talked with Dr Christena Flake nurse, has follow up with them tomorrow will discuss then She will keep follow up with Ronn Melena NP next week

## 2022-05-23 NOTE — Telephone Encounter (Signed)
Pt c/o medication issue:  1. Name of Medication: dilt xr 100mg  Dilacor   2. How are you currently taking this medication (dosage and times per day)? As written  3. Are you having a reaction (difficulty breathing--STAT)? no  4. What is your medication issue? Pt states the medication is making her sick (Nausea) and feels her heart fluttering  Patient c/o Palpitations:  High priority if patient c/o lightheadedness, shortness of breath, or chest pain  How long have you had palpitations/irregular HR/ Afib? Are you having the symptoms now? no  Are you currently experiencing lightheadedness, SOB or CP? no  Do you have a history of afib (atrial fibrillation) or irregular heart rhythm? A fib   Have you checked your BP or HR? (document readings if available):  136/91 87  118/78 80   Are you experiencing any other symptoms? Pt states she feels like she can feel her heart fluttering and some nausea. She stated that she does not like the medication above.

## 2022-05-28 ENCOUNTER — Ambulatory Visit (HOSPITAL_BASED_OUTPATIENT_CLINIC_OR_DEPARTMENT_OTHER): Payer: BC Managed Care – PPO

## 2022-05-28 ENCOUNTER — Encounter (HOSPITAL_BASED_OUTPATIENT_CLINIC_OR_DEPARTMENT_OTHER): Payer: Self-pay | Admitting: Family

## 2022-05-28 ENCOUNTER — Ambulatory Visit (HOSPITAL_BASED_OUTPATIENT_CLINIC_OR_DEPARTMENT_OTHER): Payer: BC Managed Care – PPO | Admitting: Family

## 2022-05-28 VITALS — BP 120/92 | HR 93 | Ht 66.5 in | Wt 177.0 lb

## 2022-05-28 DIAGNOSIS — I10 Essential (primary) hypertension: Secondary | ICD-10-CM

## 2022-05-28 DIAGNOSIS — I471 Supraventricular tachycardia: Secondary | ICD-10-CM

## 2022-05-28 DIAGNOSIS — R002 Palpitations: Secondary | ICD-10-CM

## 2022-05-28 DIAGNOSIS — F411 Generalized anxiety disorder: Secondary | ICD-10-CM

## 2022-05-28 MED ORDER — PROPRANOLOL HCL 20 MG PO TABS: 20.0000 mg | ORAL_TABLET | Freq: Two times a day (BID) | ORAL | 2 refills | Status: DC | PRN
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
## 2022-05-31 ENCOUNTER — Telehealth: Payer: Self-pay

## 2022-05-31 NOTE — Telephone Encounter (Signed)
Called to discuss PREP program referral, left voicemail  

## 2022-06-05 ENCOUNTER — Telehealth: Payer: Self-pay

## 2022-06-05 NOTE — Telephone Encounter (Signed)
Returned her call, she is driving and will call me back.

## 2022-06-06 ENCOUNTER — Telehealth: Payer: Self-pay | Admitting: Cardiology

## 2022-06-06 ENCOUNTER — Telehealth: Payer: Self-pay

## 2022-06-06 NOTE — Telephone Encounter (Signed)
Concerta can cause increased palpitations.  She should monitor that for this if she resumes it again.  Ultimately discussion with her primary care or psychiatrist, whomever prescribes.  Permissible from cardiac perspective.  Would recommend she monitor her blood pressure to ensure at goal of less than 130/80. Safe with her other medications.  Alver Sorrow, NP

## 2022-06-06 NOTE — Telephone Encounter (Signed)
Patient aware will forward to Dr Cristal Deer and Ronn Melena NP for review

## 2022-06-06 NOTE — Telephone Encounter (Signed)
  Pt states that her she has been on ADD medication in past called Concerta and she wants to start this medication back but she is not sure how it will affect her other cardiac medications. Please advise.

## 2022-06-06 NOTE — Telephone Encounter (Signed)
Advised patient, verbalized understanding  

## 2022-06-06 NOTE — Telephone Encounter (Signed)
She returned my call; explained PREP, she would like to attend August 8 class at [facility], every T/Th 10-11:15a; will contact end of July to confirm and set up assessment visit.

## 2022-06-07 ENCOUNTER — Telehealth: Payer: Self-pay

## 2022-06-07 NOTE — Telephone Encounter (Signed)
Returning her call; she wants to attend PREP class that starts on July 17, every M/W 2-3:15 at [facility]; assessment visit set up for July 12 at 2pm.

## 2022-06-24 ENCOUNTER — Encounter (HOSPITAL_BASED_OUTPATIENT_CLINIC_OR_DEPARTMENT_OTHER): Payer: Self-pay | Admitting: Cardiology

## 2022-06-24 ENCOUNTER — Ambulatory Visit (HOSPITAL_BASED_OUTPATIENT_CLINIC_OR_DEPARTMENT_OTHER): Payer: BC Managed Care – PPO | Admitting: Cardiology

## 2022-06-24 VITALS — BP 112/78 | HR 102 | Ht 66.5 in | Wt 177.2 lb

## 2022-06-24 DIAGNOSIS — I471 Supraventricular tachycardia: Secondary | ICD-10-CM | POA: Diagnosis not present

## 2022-06-24 DIAGNOSIS — R002 Palpitations: Secondary | ICD-10-CM | POA: Diagnosis not present

## 2022-06-24 DIAGNOSIS — R0789 Other chest pain: Secondary | ICD-10-CM

## 2022-06-24 DIAGNOSIS — Z7189 Other specified counseling: Secondary | ICD-10-CM
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
## 2022-06-24 NOTE — Patient Instructions (Addendum)
Medication Instructions:  Your Physician recommend you continue on your current medication as directed.    *If you need a refill on your cardiac medications before your next appointment, please call your pharmacy*   Lab Work: None ordered today   Testing/Procedures: None ordered today   Follow-Up: At [facility], you and your health needs are our priority.  As part of our continuing mission to provide you with exceptional heart care, we have created designated Provider Care Teams.  These Care Teams include your primary Cardiologist (physician) and Advanced Practice Providers (APPs -  Physician Assistants and Nurse Practitioners) who all work together to provide you with the care you need, when you need it.  We recommend signing up for the patient portal called "MyChart".  Sign up information is provided on this After Visit Summary.  MyChart is used to connect with patients for Virtual Visits (Telemedicine).  Patients are able to view lab/test results, encounter notes, upcoming appointments, etc.  Non-urgent messages can be sent to your provider as well.   To learn more about what you can do with MyChart, go to [email or web address].    Your next appointment:   1 year(s)  The format for your next appointment:   In Person  Provider:   Jodelle Red, MD{    [email or web address]

## 2022-06-27 ENCOUNTER — Other Ambulatory Visit: Payer: Self-pay | Admitting: Family Medicine

## 2022-06-27 DIAGNOSIS — Z1231 Encounter for screening mammogram for malignant neoplasm of breast: Secondary | ICD-10-CM

## 2022-07-08 ENCOUNTER — Ambulatory Visit: Payer: BC Managed Care – PPO | Admitting: Psychologist

## 2022-07-23 ENCOUNTER — Ambulatory Visit: Payer: BC Managed Care – PPO

## 2022-07-26 ENCOUNTER — Ambulatory Visit: Payer: BC Managed Care – PPO

## 2022-08-06 ENCOUNTER — Encounter (HOSPITAL_BASED_OUTPATIENT_CLINIC_OR_DEPARTMENT_OTHER): Payer: Self-pay | Admitting: Cardiology

## 2022-09-06 ENCOUNTER — Other Ambulatory Visit (HOSPITAL_BASED_OUTPATIENT_CLINIC_OR_DEPARTMENT_OTHER): Payer: Self-pay | Admitting: Family

## 2022-09-06 DIAGNOSIS — R002 Palpitations: Secondary | ICD-10-CM

## 2022-09-06 DIAGNOSIS — I471 Supraventricular tachycardia, unspecified: Secondary | ICD-10-CM

## 2022-09-06 NOTE — Telephone Encounter (Signed)
Rx(s) sent to pharmacy electronically.  

## 2022-09-13 ENCOUNTER — Ambulatory Visit: Payer: BC Managed Care – PPO

## 2022-09-20 ENCOUNTER — Ambulatory Visit
Admission: RE | Admit: 2022-09-20 | Discharge: 2022-09-20 | Disposition: A | Discharge status: Home / Self Care | Payer: BC Managed Care – PPO | Source: Ambulatory Visit | Attending: Family Medicine | Admitting: Family Medicine

## 2022-09-20 DIAGNOSIS — Z1231 Encounter for screening mammogram for malignant neoplasm of breast: Secondary | ICD-10-CM

## 2022-10-09 ENCOUNTER — Other Ambulatory Visit: Payer: Self-pay | Admitting: Physical Medicine and Rehabilitation

## 2022-10-09 DIAGNOSIS — R200 Anesthesia of skin: Secondary | ICD-10-CM

## 2022-11-03 ENCOUNTER — Ambulatory Visit
Admission: RE | Admit: 2022-11-03 | Discharge: 2022-11-03 | Disposition: A | Discharge status: Home / Self Care | Payer: BC Managed Care – PPO | Source: Ambulatory Visit | Attending: Physical Medicine and Rehabilitation | Admitting: Physical Medicine and Rehabilitation

## 2022-11-03 DIAGNOSIS — R200 Anesthesia of skin: Secondary | ICD-10-CM

## 2022-11-15 ENCOUNTER — Other Ambulatory Visit: Payer: Self-pay | Admitting: *Deleted

## 2022-11-15 DIAGNOSIS — M79605 Pain in left leg: Secondary | ICD-10-CM
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
## 2022-11-29 ENCOUNTER — Ambulatory Visit: Payer: BC Managed Care – PPO | Admitting: Physician Assistant

## 2022-11-29 ENCOUNTER — Ambulatory Visit (HOSPITAL_COMMUNITY)
Admission: RE | Admit: 2022-11-29 | Discharge: 2022-11-29 | Disposition: A | Discharge status: Home / Self Care | Payer: BC Managed Care – PPO | Source: Ambulatory Visit | Attending: Vascular Surgery | Admitting: Vascular Surgery

## 2022-11-29 ENCOUNTER — Encounter: Payer: Self-pay | Admitting: Physician Assistant

## 2022-11-29 VITALS — BP 123/72 | HR 70 | Temp 98.1°F | Resp 20 | Ht 66.5 in | Wt 182.2 lb

## 2022-11-29 DIAGNOSIS — M7989 Other specified soft tissue disorders: Secondary | ICD-10-CM

## 2022-11-29 DIAGNOSIS — M79604 Pain in right leg: Secondary | ICD-10-CM | POA: Diagnosis present

## 2022-11-29 DIAGNOSIS — M79605 Pain in left leg: Secondary | ICD-10-CM | POA: Insufficient documentation

## 2022-12-27 ENCOUNTER — Telehealth: Payer: Self-pay | Admitting: Cardiology

## 2022-12-27 NOTE — Telephone Encounter (Signed)
Pt c/o medication issue:  1. Name of Medication: Cyclobenzaprine 10 mg   2. How are you currently taking this medication (dosage and times per day)? Not taking currently  3. Are you having a reaction (difficulty breathing--STAT)?   4. What is your medication issue? Pt would like a call back to make sure this medication that was prescribed to her today for her back would be okay to take. Please advise.

## 2022-12-27 NOTE — Telephone Encounter (Signed)
Ok to take cyclobenzaprine

## 2022-12-27 NOTE — Telephone Encounter (Signed)
Please advise if okay to take with other medications

## 2022-12-30 NOTE — Telephone Encounter (Signed)
Returned call to patient and provided the following recommendations provided by the pharmacy.

## 2023-04-17 DIAGNOSIS — M545 Low back pain, unspecified: Secondary | ICD-10-CM | POA: Insufficient documentation

## 2023-04-17 DIAGNOSIS — M5416 Radiculopathy, lumbar region: Secondary | ICD-10-CM | POA: Insufficient documentation

## 2023-07-08 ENCOUNTER — Other Ambulatory Visit: Payer: Self-pay | Admitting: *Deleted

## 2023-07-08 DIAGNOSIS — M79604 Pain in right leg: Secondary | ICD-10-CM

## 2023-07-08 DIAGNOSIS — M7989 Other specified soft tissue disorders: Secondary | ICD-10-CM

## 2023-07-14 ENCOUNTER — Ambulatory Visit (HOSPITAL_COMMUNITY): Payer: BC Managed Care – PPO

## 2023-07-17 ENCOUNTER — Encounter (HOSPITAL_BASED_OUTPATIENT_CLINIC_OR_DEPARTMENT_OTHER): Payer: Self-pay | Admitting: Cardiology

## 2023-07-17 ENCOUNTER — Ambulatory Visit (HOSPITAL_BASED_OUTPATIENT_CLINIC_OR_DEPARTMENT_OTHER): Payer: BC Managed Care – PPO | Admitting: Cardiology

## 2023-07-17 VITALS — BP 118/86 | HR 89 | Ht 66.0 in | Wt 205.0 lb

## 2023-07-17 DIAGNOSIS — Z7189 Other specified counseling: Secondary | ICD-10-CM | POA: Diagnosis not present

## 2023-07-17 DIAGNOSIS — R002 Palpitations: Secondary | ICD-10-CM

## 2023-07-17 DIAGNOSIS — I471 Supraventricular tachycardia, unspecified: Secondary | ICD-10-CM

## 2023-07-17 NOTE — Patient Instructions (Addendum)
Medication Instructions:  Continue current medications  *If you need a refill on your cardiac medications before your next appointment, please call your pharmacy*   Lab Work: None Ordered   Testing/Procedures: None Ordered   Follow-Up: At [facility], you and your health needs are our priority.  As part of our continuing mission to provide you with exceptional heart care, we have created designated Provider Care Teams.  These Care Teams include your primary Cardiologist (physician) and Advanced Practice Providers (APPs -  Physician Assistants and Nurse Practitioners) who all work together to provide you with the care you need, when you need it.  We recommend signing up for the patient portal called "MyChart".  Sign up information is provided on this After Visit Summary.  MyChart is used to connect with patients for Virtual Visits (Telemedicine).  Patients are able to view lab/test results, encounter notes, upcoming appointments, etc.  Non-urgent messages can be sent to your provider as well.   To learn more about what you can do with MyChart, go to [email or web address].    Your next appointment:   1 year(s)  Provider:   Jodelle Red, MD    Other Instructions  Vagal maneuvers are other ways to try to manage SVT. Here is some information for the [facility]: What are vagal maneuvers? Vagal maneuvers are physical actions that make your vagus nerve act on your heart's natural pacemaker, slowing down its electrical impulses. Your vagus nerve -- which goes from your brainstem to your belly -- plays a major role in your parasympathetic nervous system, which controls a number of things in your body, including heart rate.  Healthcare providers can do vagal maneuvers when it makes sense for a person with a fast heart rate. Don't try these yourself without talking to your healthcare provider first.  Types of vagal maneuvers Healthcare providers often use  these:  Valsalva maneuver (bearing down like you're having a bowel movement (pooping). See below). Diving reflex. Carotid sinus massage. Gag reflex. Coughing. Handstand for 30 seconds. (In one study, healthcare providers taught parents how to help their kids do this.) Applied abdominal pressure. (Try lying on your back and folding your lower body toward your face until your feet are past your head. Take a breath and strain for 20 to 30 seconds.) Why are vagal maneuvers used? Vagal maneuvers are a first-line (first choice) treatment for supraventricular tachycardia (SVT) (fast heart rate) because they're a low-risk, low-cost way to slow down a heart rate that's too fast. They can have a 20% to 40% success rate for getting certain fast heart rhythms (more than 100 beats a minute) back to normal rhythms.  Vagal maneuvers can also help your healthcare provider diagnose which type of arrhythmia (irregular or abnormal beat) you have, as certain types of heart rhythm disorders classically respond to this maneuver.  Who shouldn't have vagal maneuvers? Your healthcare provider will only use vagal maneuvers if you're considered stable. They won't do vagal maneuvers if you're unstable, meaning you have:  Low blood pressure. Chest pain. Shortness of breath. A shortage of oxygen in your body. An inability to get enough blood to your organs. If you're unstable, your healthcare provider will do cardioversion (using medicine or an electrical shock) instead of vagal maneuvers. Anyone who's feeling unwell should go to an emergency room or call 911 immediately.  How commonly are vagal maneuvers used? Supraventricular tachycardia (SVT) is common in adults and children, and is the most common heart rhythm abnormality in children.  An estimated 1 in 250 to 1 in 1,000 children have SVT. Since vagal maneuvers are the first treatment choice for SVT, they're commonly used.  Procedure Details What happens before  vagal maneuvers? Your healthcare provider will do an electrocardiogram (EKG) to check your heart rhythm. They'll monitor your heart rate, blood pressure and oxygen level.  What happens during vagal maneuvers? Here's how healthcare providers do the three most common vagal maneuvers:  Diving reflex While sitting, you'll take several deep breaths, hold your breath and then quickly put your whole face into a container of ice water. Keep your face submerged as long as you can.  The alternative approach is putting a bag of ice water or an ice-cold, wet towel against your face.  Valsalva maneuver While lying on your back, take a deep breath and act like you're exhaling but with your nose and mouth closed for 10 to 30 seconds. It should feel like trying to breathe air out into a blocked straw.  In a modified version of this maneuver (which can work better than the original method), you can do this while sitting up and then have your healthcare provider quickly drop the part of the bed supporting your upper body.  When they lower your bed, they bring your knees to your chest or put your legs in the air. Keep your legs in that position 30 to 45 seconds longer than holding your breath.  Another Valsalva technique healthcare providers use for kids is to have them blow on their thumb without letting any air out.  Carotid sinus massage You'll lie on your back with your head turned to one side. Your healthcare provider will use their fingers to push on your carotid sinus for five to 10 seconds. If it doesn't work, they can try again after a minute or try the other side of your neck.  What happens after vagal maneuvers? Hopefully, the arrhythmia (irregular or abnormal beat) resolves. Your healthcare provider will do another electrocardiogram (EKG) to see if the vagal maneuver was successful at bringing your heart rhythm back to normal. If they try vagal maneuvers two or three times and they don't work, they  can give you medication to treat your arrhythmia. Medical or electrical cardioversion is another treatment option.  If vagal maneuvers don't work, your healthcare provider may contact a cardiologist (heart specialist) to evaluate you.
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
## 2023-07-24 ENCOUNTER — Encounter (HOSPITAL_COMMUNITY): Payer: BC Managed Care – PPO

## 2023-07-24 ENCOUNTER — Ambulatory Visit (HOSPITAL_COMMUNITY)
Admission: RE | Admit: 2023-07-24 | Discharge: 2023-07-24 | Disposition: A | Discharge status: Home / Self Care | Payer: BC Managed Care – PPO | Source: Ambulatory Visit | Attending: Physician Assistant | Admitting: Physician Assistant

## 2023-07-24 DIAGNOSIS — M7989 Other specified soft tissue disorders: Secondary | ICD-10-CM | POA: Insufficient documentation

## 2023-07-24 DIAGNOSIS — M79604 Pain in right leg: Secondary | ICD-10-CM | POA: Insufficient documentation

## 2023-07-24 DIAGNOSIS — M79605 Pain in left leg: Secondary | ICD-10-CM | POA: Diagnosis present

## 2023-08-08 ENCOUNTER — Ambulatory Visit: Payer: BC Managed Care – PPO | Admitting: Physician Assistant

## 2023-08-08 VITALS — BP 153/97 | HR 84 | Temp 97.8°F | Wt 205.0 lb

## 2023-08-08 DIAGNOSIS — S92512A Displaced fracture of proximal phalanx of left lesser toe(s), initial encounter for closed fracture: Secondary | ICD-10-CM | POA: Insufficient documentation

## 2023-08-08 DIAGNOSIS — M79675 Pain in left toe(s): Secondary | ICD-10-CM | POA: Insufficient documentation

## 2023-08-08 DIAGNOSIS — M7989 Other specified soft tissue disorders: Secondary | ICD-10-CM | POA: Diagnosis not present
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
## 2023-09-09 ENCOUNTER — Telehealth (HOSPITAL_BASED_OUTPATIENT_CLINIC_OR_DEPARTMENT_OTHER): Payer: Self-pay | Admitting: Cardiology

## 2023-09-09 NOTE — Telephone Encounter (Signed)
Ok with current list?

## 2023-09-09 NOTE — Telephone Encounter (Signed)
  Pt c/o medication issue:  1. Name of Medication:  escitalopram 10 mg - 1 tablet a day methylphenidate ER 36 mg - 1 tablet a day progestreron 200 mg capsule - 1 capsule at PM  estradiol vaginal cream 0.01% 1 time a day  estradiol transdermal system 0.05 mg a day - patch twice weekly  vitamin D 5000 IU - daily Tylenol - PRN for pain for her broken toe   2. How are you currently taking this medication (dosage and times per day)?   3. Are you having a reaction (difficulty breathing--STAT)? No   4. What is your medication issue? Pt said, this is her new medication and would like to ask Dr. Cristal Deer if this is ok to take with her heart meds

## 2023-09-10 NOTE — Telephone Encounter (Signed)
Left message to call back  

## 2023-09-10 NOTE — Telephone Encounter (Signed)
Methylphenidate is a stimulant which can increase heart rate and possibly cause palpitations

## 2023-09-11 NOTE — Telephone Encounter (Signed)
Returned call to patient,   She has been taking the methylphenidate for about 3 weeks and she has been noticing lots of fluttering. She states this is waking her up in the evening with her heart just racing. She would like to know if she can qualify for an ablation.   She states it is not possible for her to come off the concerta due to work and going back to school and past medical history.   Advised patient that MD not back in office until later in the week but that we would have her review and follow up with her.

## 2023-09-11 NOTE — Telephone Encounter (Signed)
2nd call attempt to patient, no answer, left detailed message (ok per DPR).

## 2023-09-11 NOTE — Telephone Encounter (Signed)
Patient returned staff call. 

## 2023-09-19 ENCOUNTER — Telehealth (HOSPITAL_BASED_OUTPATIENT_CLINIC_OR_DEPARTMENT_OTHER): Payer: Self-pay | Admitting: Cardiology

## 2023-09-19 DIAGNOSIS — I471 Supraventricular tachycardia, unspecified: Secondary | ICD-10-CM

## 2023-09-19 DIAGNOSIS — R002 Palpitations: Secondary | ICD-10-CM

## 2023-09-19 NOTE — Telephone Encounter (Signed)
Patient is calling back to follow up on if she qualifies for an ablation due to not hearing back.  Please advise.

## 2023-09-19 NOTE — Telephone Encounter (Signed)
Please see last telephone note and advise

## 2023-09-19 NOTE — Telephone Encounter (Signed)
Ablations are used for persistent episodes of SVT not controlled on medical therapy. If she would like to discuss with EP we could place a referral. However, if episodes are infrequent or short unlikely to be a candidate.   Alver Sorrow, NP

## 2023-09-19 NOTE — Telephone Encounter (Signed)
Returned call to patient, recommendations explained to patient, referral placed.   "Ablations are used for persistent episodes of SVT not controlled on medical therapy. If she would like to discuss with EP we could place a referral. However, if episodes are infrequent or short unlikely to be a candidate.    Alver Sorrow, NP"

## 2023-10-15 ENCOUNTER — Encounter: Payer: Self-pay | Admitting: Psychiatry

## 2023-10-20 ENCOUNTER — Ambulatory Visit: Payer: BC Managed Care – PPO | Attending: Cardiology | Admitting: Cardiology

## 2023-10-20 ENCOUNTER — Encounter: Payer: Self-pay | Admitting: Cardiology

## 2023-10-20 VITALS — BP 130/100 | HR 77 | Ht 66.0 in | Wt 208.4 lb

## 2023-10-20 DIAGNOSIS — R002 Palpitations: Secondary | ICD-10-CM | POA: Diagnosis not present

## 2023-10-20 DIAGNOSIS — I471 Supraventricular tachycardia, unspecified: Secondary | ICD-10-CM | POA: Diagnosis not present

## 2023-10-20 MED ORDER — VERAPAMIL HCL ER 120 MG PO TBCR: 120.0000 mg | EXTENDED_RELEASE_TABLET | Freq: Every day | ORAL | 3 refills | Status: DC

## 2023-10-20 NOTE — Patient Instructions (Addendum)
Medication Instructions:  Your physician has recommended you make the following change in your medication:  START Verapamil 120 mg daily  *If you need a refill on your cardiac medications before your next appointment, please call your pharmacy*   Lab Work: None ordered   Testing/Procedures: None ordered   Follow-Up: At [facility], you and your health needs are our priority.  As part of our continuing mission to provide you with exceptional heart care, we have created designated Provider Care Teams.  These Care Teams include your primary Cardiologist (physician) and Advanced Practice Providers (APPs -  Physician Assistants and Nurse Practitioners) who all work together to provide you with the care you need, when you need it.  We recommend signing up for the patient portal called "MyChart".  Sign up information is provided on this After Visit Summary.  MyChart is used to connect with patients for Virtual Visits (Telemedicine).  Patients are able to view lab/test results, encounter notes, upcoming appointments, etc.  Non-urgent messages can be sent to your provider as well.   To learn more about what you can do with MyChart, go to [email or web address].    Your next appointment:   3 month(s)  The format for your next appointment:   In Person  Provider:   You will see one of the following Advanced Practice Providers on your designated Care Team:   Francis Dowse, [place] "Mardelle Matte" [place], [place] Canary Brim, NP    Thank you for choosing [facility]!!   Dory Horn, RN [phone]  Other Instructions  Verapamil Tablets What is this medication? VERAPAMIL (ver AP a mil) treats high blood pressure and prevents chest pain (angina). It may also be used to treat a fast or irregular heartbeat (arrhythmia). It works by relaxing the blood vessels, which helps decrease the amount of work your heart has to do. It belongs to a group of medications called calcium channel  blockers. This medicine may be used for other purposes; ask your health care provider or pharmacist if you have questions. COMMON BRAND NAME(S): Calan What should I tell my care team before I take this medication? They need to know if you have any of these conditions: Duchenne muscular dystrophy Heart disease Irregular heartbeat or rhythm Liver disease Low blood pressure An unusual or allergic reaction to verapamil, other medications, foods, dyes or preservatives Pregnant or trying to get pregnant Breast-feeding How should I use this medication? Take this medication by mouth. Take it as directed on the prescription label at the same time every day. You can take it with or without food. If it upsets your stomach, take it with food. Keep taking it unless your care team tells you to stop. Do not take this medication with grapefruit juice. Talk to your care team about the use of this medication in children. Special care may be needed. Overdosage: If you think you have taken too much of this medicine contact a poison control center or emergency room at once. NOTE: This medicine is only for you. Do not share this medicine with others. What if I miss a dose? If you miss a dose, take it as soon as you can. If it is almost time for your next dose, take only that dose. Do not take double or extra doses. What may interact with this medication? Do not take this medication with any of the following: Cisapride Disopyramide Dofetilide Grapefruit juice Hawthorn Pimozide Red yeast rice This medication may also interact with the following: Barbiturates such  as phenobarbital Cimetidine Cyclosporine Lithium Local anesthetics or general anesthetics Medications for heart rhythm problems like amiodarone, digoxin, flecainide, procainamide, quinidine Medications for high blood pressure or heart problems Medications for seizures like carbamazepine and phenytoin Rifampin, rifabutin or  rifapentine Theophylline or aminophylline This list may not describe all possible interactions. Give your health care provider a list of all the medicines, herbs, non-prescription drugs, or dietary supplements you use. Also tell them if you smoke, drink alcohol, or use illegal drugs. Some items may interact with your medicine. What should I watch for while using this medication? Visit your care team for regular checks on your progress. Check your blood pressure as directed. Know what your blood pressure should be and when to contact your care team. Do not treat yourself for coughs, colds, or pain while you are using this medication without asking your care team for advice. Some medications may increase your blood pressure. This medication may affect your coordination, reaction time, or judgment. Do not drive or operate machinery until you know how this medication affects you. Sit up or stand slowly to reduce the risk of dizzy or fainting spells. Drinking alcohol with this medication can increase the risk of these side effects. What side effects may I notice from receiving this medication? Side effects that you should report to your care team as soon as possible: Allergic reactions--skin rash, itching, hives, swelling of the face, lips, tongue, or throat Heart failure--shortness of breath, swelling of the ankles, feet, or hands, sudden weight gain, unusual weakness or fatigue Slow heartbeat--dizziness, feeling faint or lightheaded, trouble breathing, unusual weakness or fatigue Liver injury--right upper belly pain, loss of appetite, nausea, light-colored stool, dark yellow or brown urine, yellowing skin or eyes, unusual weakness or fatigue Low blood pressure--dizziness, feeling faint or lightheaded, blurry vision Side effects that usually do not require medical attention (report to your care team if they continue or are bothersome): Constipation Dizziness Headache Nausea This list may not describe  all possible side effects. Call your doctor for medical advice about side effects. You may report side effects to FDA at 1-800-FDA-1088. Where should I keep my medication? Keep out of the reach of children and pets. Store at room temperature between 20 and 25 degrees C (68 and 77 degrees F). Protect from light. Throw away any unused medication after the expiration date. NOTE: This sheet is a summary. It may not cover all possible information. If you have questions about this medicine, talk to your doctor, pharmacist, or health care provider.  2024 Elsevier/Gold Standard (2022-11-14 00:00:00)

## 2023-10-20 NOTE — Progress Notes (Signed)
  Electrophysiology Office Note:   Date:  10/20/2023  ID:  Nicole Brewer, DOB January 29, 1966, MRN [id number]  Primary Cardiologist: Jodelle Red, MD Electrophysiologist: None      History of Present Illness:   Nicole Brewer is a 57 y.o. female with h/o SVT, traumatic brain injury, hypertension, GERD, asthma, obesity, anxiety, depression seen today for  for Electrophysiology evaluation of SVT at the request of Janne Napoleon.    She has occasional palpitations.  She states that her heart rate runs quite rapidly at times.  Feels like she may be running a race.  Heartbeats mildly and beat out of her chest.  She feels symptoms multiple times a day, but most often they occur at night.  She says that she has night terrors and quite a bit of anxiety.  They do wake her up at night.  She states that her heart rate is at times over 100 bpm.  Review of systems complete and found to be negative unless listed in HPI.   EP Information / Studies Reviewed:    EKG is ordered today. Personal review as below.  EKG Interpretation Date/Time:  Monday October 20 2023 09:17:57 EST Ventricular Rate:  77 PR Interval:  112 QRS Duration:  80 QT Interval:  376 QTC Calculation: 425 R Axis:   50  Text Interpretation: Normal sinus rhythm Normal ECG When compared with ECG of 17-Jul-2023 09:22, No significant change was found Confirmed by Tyquez Hollibaugh ([id number]) on 10/20/2023 9:21:42 AM     Risk Assessment/Calculations:             Physical Exam:   VS:  BP (!) 130/100 (BP Location: Left Arm, Patient Position: Sitting, Cuff Size: Large)   Pulse 77   Ht 5\' 6"  (1.676 m)   Wt 208 lb 6.4 oz (94.5 kg)   SpO2 97%   BMI 33.64 kg/m    Wt Readings from Last 3 Encounters:  10/20/23 208 lb 6.4 oz (94.5 kg)  08/08/23 205 lb (93 kg)  07/17/23 205 lb (93 kg)     GEN: Well nourished, well developed in no acute distress NECK: No JVD; No carotid bruits CARDIAC: Regular rate and rhythm, no murmurs, rubs,  gallops RESPIRATORY:  Clear to auscultation without rales, wheezing or rhonchi  ABDOMEN: Soft, non-tender, non-distended EXTREMITIES:  No edema; No deformity   ASSESSMENT AND PLAN:    SVT: Has had multiple episodes of SVT, mainly occurring at night.  Cardiac monitor performed in 2022 shows sinus rhythm and sinus tachycardia.  Is unclear to me if she is having actual arrhythmias.  Despite this, we Nicole Brewer start verapamil 120 mg daily.  Nicole Brewer have her follow-up with the EP app.  If at that time she is feeling well, she can be further managed by her primary cardiologist.  She does have further documentation of SVT, could plan for alternative therapy.  Follow up with EP APP in 3 months  Signed, Nicole Burby Jorja Loa, MD

## 2024-01-02 DIAGNOSIS — M21622 Bunionette of left foot: Secondary | ICD-10-CM | POA: Insufficient documentation

## 2024-01-15 ENCOUNTER — Other Ambulatory Visit: Payer: Self-pay | Admitting: Cardiology

## 2024-01-19 NOTE — Progress Notes (Deleted)
  Electrophysiology Office Note:   Date:  01/19/2024  ID:  Vanessia Bokhari, DOB 1966-07-18, MRN [id number]  Primary Cardiologist: Jodelle Red, MD Electrophysiologist: Will Jorja Loa, MD  {Click to update primary MD,subspecialty MD or APP then REFRESH:1}    History of Present Illness:   Johan Antonacci is a 58 y.o. female with h/o SVT, Traumatic brain injury, HTN, GERD, asthma, obesity, and anxiety/depression seen today for routine electrophysiology followup.   Since last being seen in our clinic the patient reports doing ***.  she denies chest pain, palpitations, dyspnea, PND, orthopnea, nausea, vomiting, dizziness, syncope, edema, weight gain, or early satiety.   Review of systems complete and found to be negative unless listed in HPI.   EP Information / Studies Reviewed:    EKG is ordered today. Personal review as below.       Arrhythmia/Device History Monitor 2022 showed NSR and sinus tach  Physical Exam:   VS:  There were no vitals taken for this visit.   Wt Readings from Last 3 Encounters:  10/20/23 208 lb 6.4 oz (94.5 kg)  08/08/23 205 lb (93 kg)  07/17/23 205 lb (93 kg)     GEN: No acute distress NECK: No JVD; No carotid bruits CARDIAC: {EPRHYTHM:28826}, no murmurs, rubs, gallops RESPIRATORY:  Clear to auscultation without rales, wheezing or rhonchi  ABDOMEN: Soft, non-tender, non-distended EXTREMITIES:  {EDEMA LEVEL:28147::"No"} edema; No deformity   ASSESSMENT AND PLAN:    SVTs Multiple episodes, mainly at night.  Continue verapmil 120 mg daily ***  {Click here to Review PMH, Prob List, Meds, Allergies, SHx, FHx  :1}   Follow up with {BJYNW:29562} {EPFOLLOW ZH:08657}  Signed, Graciella Freer, PA-C

## 2024-01-20 ENCOUNTER — Ambulatory Visit: Payer: BC Managed Care – PPO | Admitting: Student

## 2024-01-20 DIAGNOSIS — I471 Supraventricular tachycardia, unspecified: Secondary | ICD-10-CM
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
## 2024-01-30 ENCOUNTER — Ambulatory Visit: Payer: 59 | Attending: Physician Assistant | Admitting: Physician Assistant

## 2024-01-30 ENCOUNTER — Encounter: Payer: Self-pay | Admitting: Physician Assistant

## 2024-01-30 VITALS — BP 142/92 | HR 62 | Ht 66.5 in | Wt 210.0 lb

## 2024-01-30 DIAGNOSIS — R002 Palpitations: Secondary | ICD-10-CM

## 2024-01-30 DIAGNOSIS — I471 Supraventricular tachycardia, unspecified: Secondary | ICD-10-CM

## 2024-01-30 DIAGNOSIS — R000 Tachycardia, unspecified: Secondary | ICD-10-CM

## 2024-01-30 NOTE — Patient Instructions (Signed)
 Medication Instructions:   STOP TAKING : VERAPAMIL    *If you need a refill on your cardiac medications before your next appointment, please call your pharmacy*   Lab Work: NONE ORDERED  TODAY    If you have labs (blood work) drawn today and your tests are completely normal, you will receive your results only by: MyChart Message (if you have MyChart) OR A paper copy in the mail If you have any lab test that is abnormal or we need to change your treatment, we will call you to review the results.   Testing/Procedures:  NONE ORDERED  TODAY     Follow-Up: At [facility], you and your health needs are our priority.  As part of our continuing mission to provide you with exceptional heart care, we have created designated Provider Care Teams.  These Care Teams include your primary Cardiologist (physician) and Advanced Practice Providers (APPs -  Physician Assistants and Nurse Practitioners) who all work together to provide you with the care you need, when you need it.  We recommend signing up for the patient portal called "MyChart".  Sign up information is provided on this After Visit Summary.  MyChart is used to connect with patients for Virtual Visits (Telemedicine).  Patients are able to view lab/test results, encounter notes, upcoming appointments, etc.  Non-urgent messages can be sent to your provider as well.   To learn more about what you can do with MyChart, go to [email or web address].    Your next appointment:   ( CONTACT  CASSIE HALL/ ANGELINE HAMMER FOR EP SCHEDULING ISSUES )  3 month(s)   Provider:   Francis Dowse, PA-C   Other Instructions

## 2024-02-12 ENCOUNTER — Ambulatory Visit: Payer: 59 | Admitting: Student

## 2024-02-13 ENCOUNTER — Telehealth: Payer: Self-pay | Admitting: Family

## 2024-02-13 ENCOUNTER — Ambulatory Visit: Payer: 59 | Admitting: Physician Assistant

## 2024-02-13 DIAGNOSIS — Z723 Lack of physical exercise: Secondary | ICD-10-CM

## 2024-02-13 DIAGNOSIS — R002 Palpitations: Secondary | ICD-10-CM

## 2024-02-13 DIAGNOSIS — R000 Tachycardia, unspecified: Secondary | ICD-10-CM

## 2024-02-13 NOTE — Telephone Encounter (Signed)
 Routing this to you as you have seen the pt most recently and can attest to if pt is appropriate for prep referral !

## 2024-02-13 NOTE — Telephone Encounter (Signed)
 Prep ordered, patient notified.

## 2024-02-13 NOTE — Telephone Encounter (Signed)
 Pt would like the info/referral for the PREP program that was discussed at her ov on 05/28/22 with C. Dan Humphreys

## 2024-02-16 ENCOUNTER — Telehealth: Payer: Self-pay

## 2024-02-16 NOTE — Telephone Encounter (Signed)
 Returned her call, she attempted PREP in 2023, but decided to wait until she moved closer; now lives in [place], wants to attend March 25 class, every T/Th 12-1:15; assessment visit scheduled for 3/20 at [facility]
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
## 2024-03-02 NOTE — Progress Notes (Signed)
 [facility] PREP Weekly Session  Patient Details  Name: Nicole Brewer MRN: [id number] Date of Birth: 1966-12-01 Age: 58 y.o. PCP: Shon Hale, MD  There were no vitals filed for this visit.   [facility] Weekly seesion - 03/02/24 1300       [facility] "PREP" Location   [facility] "PREP" Location [facility]      Weekly Session   Topic Discussed Goal setting and welcome to the program   Introductions, review of notebook and program, tour of facility   Classes attended to date 1             Keniya Schlotterbeck B Titilayo Hagans 03/02/2024, 1:15 PM

## 2024-03-09 NOTE — Progress Notes (Signed)
 [facility] PREP Weekly Session  Patient Details  Name: Nicole Brewer MRN: [id number] Date of Birth: Jan 16, 1966 Age: 58 y.o. PCP: Shon Hale, MD  There were no vitals filed for this visit.   [facility] Weekly seesion - 03/09/24 1300       [facility] "PREP" Location   [facility] "PREP" Location [facility]      Weekly Session   Topic Discussed Importance of resistance training;Other ways to be active   Goal: work up to 150 min/cardio a week; strength training twice a week work up to 3 times a week for 20-40 minutes; sitting no more than 30 minutes   Classes attended to date 3             Nicole Brewer 03/09/2024, 1:57 PM

## 2024-03-16 NOTE — Progress Notes (Signed)
 [facility] PREP Weekly Session  Patient Details  Name: Nicole Brewer MRN: [id number] Date of Birth: [birth year] Age: 58 y.o. PCP: Ransom Byers, MD  Vitals:   03/16/24 1350  Weight: 213 lb 6.4 oz (96.8 kg)     [facility] Weekly seesion - 03/16/24 1300       [facility] "PREP" Location   [facility] "PREP" Location [facility]      Weekly Session   Topic Discussed Healthy eating tips   foods to reduce, foods to increase; introduced to [employer]; eat the rainbow of colors   Minutes exercised this week 35 minutes    Classes attended to date 5             Josedejesus Marcum B Miki Labuda 03/16/2024, 1:51 PM

## 2024-03-23 NOTE — Progress Notes (Signed)
 [facility] PREP Weekly Session  Patient Details  Name: Nicole Brewer MRN: [id number] Date of Birth: 1966/07/04 Age: 58 y.o. PCP: Ransom Byers, MD  Vitals:   03/23/24 1312  Weight: 212 lb 12.8 oz (96.5 kg)     [facility] Weekly seesion - 03/23/24 1300       [facility] "PREP" Location   [facility] "PREP" Location [facility]      Weekly Session   Topic Discussed Health habits   sugar demo   Minutes exercised this week 110 minutes    Classes attended to date 7             Lucifer Soja B Larie Mathes 03/23/2024, 1:12 PM
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
## 2024-03-29 ENCOUNTER — Encounter: Payer: Self-pay | Admitting: Physician Assistant

## 2024-03-29 ENCOUNTER — Ambulatory Visit: Payer: 59 | Attending: Physician Assistant | Admitting: Physician Assistant

## 2024-03-29 VITALS — BP 128/60 | HR 98 | Ht 67.0 in | Wt 219.8 lb

## 2024-03-29 DIAGNOSIS — I471 Supraventricular tachycardia, unspecified: Secondary | ICD-10-CM

## 2024-03-29 DIAGNOSIS — R002 Palpitations: Secondary | ICD-10-CM | POA: Diagnosis not present

## 2024-03-29 MED ORDER — DILTIAZEM HCL 30 MG PO TABS: 30.0000 mg | ORAL_TABLET | Freq: Four times a day (QID) | ORAL | 0 refills | Status: DC | PRN

## 2024-03-29 NOTE — Addendum Note (Signed)
 Addended by: Ripley Chen on: 03/29/2024 03:09 PM   Modules accepted: Orders

## 2024-03-29 NOTE — Patient Instructions (Addendum)
 Medication Instructions:   START TAKING :  DILTIAZEM 30 MG AS NEEDED EVERY 6 HOURS FOR PALPITATIONS  *If you need a refill on your cardiac medications before your next appointment, please call your pharmacy*  Lab Work:.NONE ORDERED  TODAY   If you have labs (blood work) drawn today and your tests are completely normal, you will receive your results only by: MyChart Message (if you have MyChart) OR A paper copy in the mail If you have any lab test that is abnormal or we need to change your treatment, we will call you to review the results.  Testing/Procedures: NONE ORDERED  TODAY    Follow-Up: At [facility], you and your health needs are our priority.  As part of our continuing mission to provide you with exceptional heart care, our providers are all part of one team.  This team includes your primary Cardiologist (physician) and Advanced Practice Providers or APPs (Physician Assistants and Nurse Practitioners) who all work together to provide you with the care you need, when you need it.  Your next appointment:    2 month(s) ( CONTACT  CASSIE HALL/ ANGELINE HAMMER FOR EP SCHEDULING ISSUES )   Provider:   You may see Will Cortland Ding, MD or one of the following Advanced Practice Providers on your designated Care Team:   Mertha Abrahams, [place]  We recommend signing up for the patient portal called "MyChart".  Sign up information is provided on this After Visit Summary.  MyChart is used to connect with patients for Virtual Visits (Telemedicine).  Patients are able to view lab/test results, encounter notes, upcoming appointments, etc.  Non-urgent messages can be sent to your provider as well.   To learn more about what you can do with MyChart, go to [email or web address].   Other Instructions

## 2024-03-30 DIAGNOSIS — M25579 Pain in unspecified ankle and joints of unspecified foot: Secondary | ICD-10-CM | POA: Insufficient documentation

## 2024-03-30 NOTE — Progress Notes (Signed)
 [facility] PRE[facility] Weekly Session  Patient Details  Name: Nicole Brewer MRN: [id number] Date of Birth: 01-26-1966 Age: 58 y.o. PCP: Ransom Byers, MD  Vitals:   03/30/24 1328  Weight: 221 lb (100.2 kg)     [facility] Weekly seesion - 03/30/24 1300       [facility] "PREP" Location   [facility] "PREP" Location [facility]      Weekly Session   Topic Discussed Restaurant Eating   Salt demo; limit salt intake to 1500mg -2300mg /day   Minutes exercised this week 40 minutes    Classes attended to date 8             Nicole Brewer B Nicole Brewer 03/30/2024, 1:29 PM

## 2024-04-13 NOTE — Progress Notes (Signed)
 [employer] PREP Weekly Session  Patient Details  Name: Nicole Brewer MRN: [id number] Date of Birth: 1966/09/03 Age: 58 y.o. PCP: Ransom Byers, MD  Vitals:   04/13/24 1254  Weight: 212 lb 12.8 oz (96.5 kg)     [facility] Weekly seesion - 04/13/24 1200       [facility] "PREP" Location   [facility] "PREP" Location [facility]      Weekly Session   Topic Discussed Expectations and non-scale victories   [place] through program, review, revisit, restate goals; staying postive.   Minutes exercised this week 250 minutes    Classes attended to date 10             Jim Motts 04/13/2024, 12:56 PM

## 2024-04-18 ENCOUNTER — Other Ambulatory Visit: Payer: Self-pay | Admitting: Physician Assistant

## 2024-04-20 NOTE — Progress Notes (Signed)
 [facility] PREP Weekly Session  Patient Details  Name: Nicole Brewer MRN: [id number] Date of Birth: 03-May-1966 Age: 58 y.o. PCP: Ransom Byers, MD  Vitals:   04/20/24 1336  Weight: 214 lb 6.4 oz (97.3 kg)     [facility] Weekly seesion - 04/20/24 1300       [facility] "PREP" Location   [facility] "PREP" Location [facility]      Weekly Session   Topic Discussed Other   Portion size matters; visualize your portion size demo; review of [employer] food label.   Classes attended to date 81             Carron Mcmurry B Jaman Aro 04/20/2024, 1:39 PM

## 2024-04-27 DIAGNOSIS — M25562 Pain in left knee: Secondary | ICD-10-CM | POA: Insufficient documentation

## 2024-04-27 DIAGNOSIS — M7671 Peroneal tendinitis, right leg: Secondary | ICD-10-CM | POA: Insufficient documentation

## 2024-04-27 NOTE — Progress Notes (Signed)
 [facility] PREP Weekly Session  Patient Details  Name: Nicole Brewer MRN: [id number] Date of Birth: 1966/07/15 Age: 57 y.o. PCP: Ransom Byers, MD  Vitals:   04/27/24 1338  Weight: 218 lb 6.4 oz (99.1 kg)     [facility] Weekly seesion - 04/27/24 1300       [facility] "PREP" Location   [facility] "PREP" Location [facility]      Weekly Session   Topic Discussed Calorie breakdown   USDA guidelines for carbohydrates, fats, and protein, complex carbs, reviewed trustworthy suppliment organiztions   Classes attended to date [address] 04/27/2024, 1:39 PM

## 2024-04-29 DIAGNOSIS — M25462 Effusion, left knee: Secondary | ICD-10-CM | POA: Insufficient documentation

## 2024-04-29 DIAGNOSIS — Q682 Congenital deformity of knee: Secondary | ICD-10-CM | POA: Insufficient documentation

## 2024-05-04 NOTE — Progress Notes (Signed)
 [employer] PREP Weekly Session  Patient Details  Name: Phinley Schall MRN: [id number] Date of Birth: [birth year] Age: 58 y.o. PCP: Ransom Byers, MD  There were no vitals filed for this visit.   [facility] Weekly seesion - 05/04/24 1300       [facility] "PREP" Location   [facility] "PREP" Location [facility]      Weekly Session   Topic Discussed Finding support   Discussed finding supportive people, membership talk   Classes attended to date [address] 05/04/2024, 1:59 PM

## 2024-05-07 ENCOUNTER — Other Ambulatory Visit: Payer: Self-pay | Admitting: Family Medicine

## 2024-05-07 DIAGNOSIS — R29898 Other symptoms and signs involving the musculoskeletal system: Secondary | ICD-10-CM

## 2024-05-07 DIAGNOSIS — R32 Unspecified urinary incontinence: Secondary | ICD-10-CM

## 2024-05-07 DIAGNOSIS — M545 Low back pain, unspecified: Secondary | ICD-10-CM

## 2024-05-11 ENCOUNTER — Ambulatory Visit
Admission: RE | Admit: 2024-05-11 | Discharge: 2024-05-11 | Disposition: A | Discharge status: Home / Self Care | Source: Ambulatory Visit | Attending: Family Medicine | Admitting: Family Medicine

## 2024-05-11 DIAGNOSIS — R29898 Other symptoms and signs involving the musculoskeletal system: Secondary | ICD-10-CM

## 2024-05-11 DIAGNOSIS — R32 Unspecified urinary incontinence: Secondary | ICD-10-CM

## 2024-05-11 DIAGNOSIS — M545 Low back pain, unspecified: Secondary | ICD-10-CM

## 2024-05-18 NOTE — Progress Notes (Signed)
 [facility] PREP Weekly Session  Patient Details  Name: Nicole Brewer MRN: [id number] Date of Birth: [birth year] Age: 58 y.o. PCP: Ransom Byers, MD  Vitals:   05/18/24 1509  Weight: 214 lb 8 oz (97.3 kg)     [facility] Weekly seesion - 05/18/24 1500       [facility] PREP Location   [facility] PREP Location [facility]      Weekly Session   Topic Discussed Other   Fit testing completed; how fit and strong survey completed; appt for final assessment visit scheduled.   Classes attended to date 33          Jahquan Klugh B Quaneisha Hanisch 05/18/2024, 3:11 PM

## 2024-05-21 DIAGNOSIS — M542 Cervicalgia: Secondary | ICD-10-CM | POA: Insufficient documentation

## 2024-05-21 DIAGNOSIS — M546 Pain in thoracic spine: Secondary | ICD-10-CM | POA: Insufficient documentation
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
## 2024-06-01 DIAGNOSIS — G988 Other disorders of nervous system: Secondary | ICD-10-CM | POA: Insufficient documentation

## 2024-06-02 ENCOUNTER — Other Ambulatory Visit: Payer: Self-pay

## 2024-06-02 MED ORDER — DILTIAZEM HCL 30 MG PO TABS: 30.0000 mg | ORAL_TABLET | Freq: Four times a day (QID) | ORAL | 9 refills | Status: AC | PRN

## 2024-06-07 DIAGNOSIS — F40232 Fear of other medical care: Secondary | ICD-10-CM | POA: Insufficient documentation
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
## 2024-06-11 ENCOUNTER — Encounter: Payer: Self-pay | Admitting: Physician Assistant

## 2024-06-11 ENCOUNTER — Ambulatory Visit: Attending: Physician Assistant | Admitting: Physician Assistant

## 2024-06-11 VITALS — BP 128/94 | HR 80 | Ht 66.5 in | Wt 220.1 lb

## 2024-06-11 DIAGNOSIS — I471 Supraventricular tachycardia, unspecified: Secondary | ICD-10-CM

## 2024-06-11 NOTE — Patient Instructions (Signed)
 Medication Instructions:   Your physician recommends that you continue on your current medications as directed. Please refer to the Current Medication list given to you today.  *If you need a refill on your cardiac medications before your next appointment, please call your pharmacy*  Lab Work: .nno  If you have labs (blood work) drawn today and your tests are completely normal, you will receive your results only by: MyChart Message (if you have MyChart) OR A paper copy in the mail If you have any lab test that is abnormal or we need to change your treatment, we will call you to review the results.   Testing/Procedures: NONE ORDERED  TODAY    Follow-Up: At [facility], you and your health needs are our priority.  As part of our continuing mission to provide you with exceptional heart care, our providers are all part of one team.  This team includes your primary Cardiologist (physician) and Advanced Practice Providers or APPs (Physician Assistants and Nurse Practitioners) who all work together to provide you with the care you need, when you need it.  Your next appointment:   6 month(s)  Provider:   You may see Will Gladis Norton, MD or one of the following Advanced Practice Providers on your designated Care Team:   Charlies Arthur, [place]   We recommend signing up for the patient portal called MyChart.  Sign up information is provided on this After Visit Summary.  MyChart is used to connect with patients for Virtual Visits (Telemedicine).  Patients are able to view lab/test results, encounter notes, upcoming appointments, etc.  Non-urgent messages can be sent to your provider as well.   To learn more about what you can do with MyChart, go to [email or web address].   Other Instructions
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
## 2024-06-22 ENCOUNTER — Ambulatory Visit: Admitting: Neurology

## 2024-06-22 ENCOUNTER — Encounter: Payer: Self-pay | Admitting: Neurology

## 2024-06-22 VITALS — BP 135/86 | HR 86 | Ht 66.5 in | Wt 214.5 lb

## 2024-06-22 DIAGNOSIS — S069X5A Unspecified intracranial injury with loss of consciousness greater than 24 hours with return to pre-existing conscious level, initial encounter: Secondary | ICD-10-CM | POA: Diagnosis not present

## 2024-06-22 DIAGNOSIS — G379 Demyelinating disease of central nervous system, unspecified: Secondary | ICD-10-CM | POA: Diagnosis not present

## 2024-06-22 DIAGNOSIS — R269 Unspecified abnormalities of gait and mobility: Secondary | ICD-10-CM | POA: Diagnosis not present

## 2024-06-22 DIAGNOSIS — R200 Anesthesia of skin: Secondary | ICD-10-CM | POA: Diagnosis not present

## 2024-06-23 ENCOUNTER — Telehealth: Payer: Self-pay | Admitting: Neurology

## 2024-06-23 NOTE — Telephone Encounter (Signed)
 no auth required sent to GI per patient request on order. [phone]

## 2024-06-26 ENCOUNTER — Ambulatory Visit: Payer: Self-pay | Admitting: Neurology

## 2024-06-26 DIAGNOSIS — Z79899 Other long term (current) drug therapy: Secondary | ICD-10-CM

## 2024-06-26 DIAGNOSIS — G35 Multiple sclerosis: Secondary | ICD-10-CM

## 2024-06-26 LAB — ANTI-AQUAPORIN (AQP4), SERUM: AQP4 Antibody, Cell-based IFA: NEGATIVE

## 2024-06-26 LAB — ANTI-MOG, SERUM: MOG Antibody, Cell-based IFA: NEGATIVE

## 2024-06-26 LAB — ANCA PROFILE
Anti-MPO Antibodies: <0.2 U (ref 0.0–0.9)
Anti-PR3 Antibodies: <0.2 U (ref 0.0–0.9)
Atypical pANCA: <1:20 {titer}
C-ANCA: <1:20 {titer}
P-ANCA: <1:20 {titer}

## 2024-06-26 LAB — QUANTIFERON-TB GOLD PLUS
QuantiFERON Mitogen Value: >10 [IU]/mL
QuantiFERON Nil Value: 0.01 [IU]/mL
QuantiFERON TB1 Ag Value: 0.01 [IU]/mL
QuantiFERON TB2 Ag Value: 0.01 [IU]/mL
QuantiFERON-TB Gold Plus: NEGATIVE

## 2024-06-26 LAB — ANGIOTENSIN CONVERTING ENZYME: Angio Convert Enzyme: 47 U/L (ref 14–82)

## 2024-06-26 MED ORDER — TERIFLUNOMIDE 14 MG PO TABS: 14.0000 mg | ORAL_TABLET | Freq: Every day | ORAL | 3 refills | Status: DC

## 2024-06-28 NOTE — Telephone Encounter (Signed)
 Called pt at [phone]. Relayed results. She is wondering if she should do MRI Brain first prior to starting teriflunomide . Aware I will speak with MD and call back. She is scheduled for MRI Brain 07/05/24.  She will also call [facility] to see if she will be picking up there or if it will be coming from mail order SP.   Per Dr. Vear, she should complete MRI first prior to starting teriflunomide . She verbalized understanding. Aware we will call back to go over MRI results.

## 2024-06-28 NOTE — Telephone Encounter (Signed)
-----   Message from Charlie DELENA Crete sent at 06/26/2024  6:03 PM EDT ----- Please let her know that the additional blood work looked good.  I will go ahead and send in teriflunomide  and she should take 1 a day when she gets the prescription.  Additionally she needs to do a  liver test once a month for the next 5 months and I sent in that order for her to come to our office.  If easier for her, this can be sent to [facility] instead ----- Message ----- From: Interface, [facility] Lab Results In Sent: 06/23/2024   7:37 AM EDT To: Charlie DELENA Crete, MD

## 2024-07-05 ENCOUNTER — Ambulatory Visit
Admission: RE | Admit: 2024-07-05 | Discharge: 2024-07-05 | Disposition: A | Discharge status: Home / Self Care | Source: Ambulatory Visit | Attending: Neurology | Admitting: Neurology

## 2024-07-05 DIAGNOSIS — R269 Unspecified abnormalities of gait and mobility: Secondary | ICD-10-CM | POA: Diagnosis not present

## 2024-07-05 DIAGNOSIS — G379 Demyelinating disease of central nervous system, unspecified: Secondary | ICD-10-CM | POA: Diagnosis not present

## 2024-07-05 DIAGNOSIS — S069X5A Unspecified intracranial injury with loss of consciousness greater than 24 hours with return to pre-existing conscious level, initial encounter: Secondary | ICD-10-CM | POA: Diagnosis not present

## 2024-07-05 MED ORDER — GADOPICLENOL 0.5 MMOL/ML IV SOLN
10.0000 mL | Freq: Once | INTRAVENOUS | Status: AC | PRN
  Administered 2024-07-05: 10 mL via INTRAVENOUS
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
## 2024-07-07 NOTE — Telephone Encounter (Signed)
-----   Message from Charlie DELENA Crete sent at 07/06/2024  5:37 PM EDT ----- Please let her know that the MRI of the brain did show a few spots that could be consistent with MS.  Since there are just a small number of spots and she is 58, we could hold off on the  teriflunomide  and recheck the MRI in another 9 to 12 months to see if there are additional changes (if there were additional changes we could then consider starting the teriflunomide ) ----- Message ----- From: Crete Charlie DELENA, MD Sent: 07/05/2024   7:08 PM EDT To: Charlie DELENA Crete, MD

## 2024-07-07 NOTE — Addendum Note (Signed)
 Addended by: JOSHUA MAURILIO CROME on: 07/07/2024 10:37 AM   Modules accepted: Orders

## 2024-07-09 NOTE — Telephone Encounter (Signed)
 Pt is asking for a call back from Rn to discuss her diagnosis more in detail.

## 2024-07-12 NOTE — Telephone Encounter (Signed)
 Called pt at [phone].  She states she called 07/08/24 but there is no record of this. I apologized.   She wants to know positive/negatives of taking MS DMT. Certainty of MS dx? I went over results from Dr. Vear again. She would prefer to come back in for appointment to discuss with Dr. Vear. I scheduled appt for 07/14/24 at 930a, check in 9am. She verbalized understanding.

## 2024-07-14 ENCOUNTER — Ambulatory Visit: Admitting: Neurology

## 2024-07-14 ENCOUNTER — Encounter: Payer: Self-pay | Admitting: Neurology

## 2024-07-14 VITALS — BP 121/83 | HR 78 | Ht 66.0 in | Wt 217.0 lb

## 2024-07-14 DIAGNOSIS — R200 Anesthesia of skin: Secondary | ICD-10-CM

## 2024-07-14 DIAGNOSIS — G35 Multiple sclerosis: Secondary | ICD-10-CM

## 2024-07-14 DIAGNOSIS — G379 Demyelinating disease of central nervous system, unspecified: Secondary | ICD-10-CM | POA: Diagnosis not present

## 2024-07-14 DIAGNOSIS — G35A Relapsing-remitting multiple sclerosis: Secondary | ICD-10-CM

## 2024-07-14 DIAGNOSIS — Z79899 Other long term (current) drug therapy: Secondary | ICD-10-CM | POA: Diagnosis not present

## 2024-07-14 DIAGNOSIS — E559 Vitamin D deficiency, unspecified: Secondary | ICD-10-CM | POA: Diagnosis not present

## 2024-07-14 DIAGNOSIS — S069X5A Unspecified intracranial injury with loss of consciousness greater than 24 hours with return to pre-existing conscious level, initial encounter: Secondary | ICD-10-CM

## 2024-07-14 DIAGNOSIS — R269 Unspecified abnormalities of gait and mobility: Secondary | ICD-10-CM

## 2024-07-14 MED ORDER — VITAMIN D (ERGOCALCIFEROL) 1.25 MG (50000 UNIT) PO CAPS: 50000.0000 [IU] | ORAL_CAPSULE | ORAL | 1 refills | Status: DC
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
## 2024-07-15 ENCOUNTER — Ambulatory Visit: Payer: Self-pay | Admitting: Neurology

## 2024-07-15 LAB — HEPATIC FUNCTION PANEL
ALT: 18 IU/L (ref 0–32)
AST: 23 IU/L (ref 0–40)
Albumin: 4.1 g/dL (ref 3.8–4.9)
Alkaline Phosphatase: 54 IU/L (ref 44–121)
Bilirubin Total: 0.6 mg/dL (ref 0.0–1.2)
Bilirubin, Direct: 0.21 mg/dL (ref 0.00–0.40)
Total Protein: 6.3 g/dL (ref 6.0–8.5)

## 2024-07-15 LAB — VITAMIN D 25 HYDROXY (VIT D DEFICIENCY, FRACTURES): Vit D, 25-Hydroxy: 24.5 ng/mL — ABNORMAL LOW (ref 30.0–100.0)

## 2024-07-15 NOTE — Telephone Encounter (Addendum)
 Called pt back and relayed below message. Pt states that she has picked up her medication and that she will take it once a week.   ----- Message from Charlie DELENA Crete sent at 07/15/2024  8:35 AM EDT ----- Vitamin D  was low   so she should take the high dose supplements that I sent in to her pharmacy - one a week ----- Message ----- From: Rebecka Memos Lab Results In Sent: 07/15/2024   5:37 AM EDT To: Charlie DELENA Crete, MD

## 2024-07-15 NOTE — Telephone Encounter (Addendum)
 LVM with the below Lab Results per Dr. Vear.   ----- Message from Charlie DELENA Vear sent at 07/15/2024  8:35 AM EDT ----- Vitamin D  was low   so she should take the high dose supplements that I sent in to her pharmacy - one a week ----- Message ----- From: Interface, [facility] Lab Results In Sent: 07/15/2024   5:37 AM EDT To: Charlie DELENA Vear, MD

## 2024-07-15 NOTE — Telephone Encounter (Signed)
 Pt has called back to correct information that she just gave CMA

## 2024-07-20 NOTE — Discharge Instructions (Signed)
 Lumbar Puncture Discharge Instructions  You may resume normal activities; however, do not exert yourself strongly or do any heavy lifting today and tomorrow.   DO NOT drive today.    You may resume your normal diet and medications unless otherwise indicated. Drink a lot of extra fluids today and tomorrow.   The incidence of a spinal headache (headache, nausea and/or vomiting is about 5% (one in 20 patients).  If you develop a headache when you are sitting up or standing that gets better when you lie down, please lie flat for 24 hours and drink plenty of fluids until the headache goes away.  Caffeinated beverages may be helpful.   If you develop severe nausea and vomiting or a headache that does not go away with the flat bedrest after 48 hours, please call [phone].   Call your physician for a follow-up appointment.  The results of your myelogram will be sent directly to your physician by the following day.  Please call us  at [phone] if you have any questions or if complications develop after you arrive home.     Thank you for visiting [facility] today!

## 2024-07-21 ENCOUNTER — Inpatient Hospital Stay
Admission: RE | Admit: 2024-07-21 | Discharge: 2024-07-21 | Disposition: A | Discharge status: Home / Self Care | Source: Ambulatory Visit | Attending: Neurology | Admitting: Neurology

## 2024-07-21 VITALS — BP 144/89 | HR 77

## 2024-07-21 DIAGNOSIS — G379 Demyelinating disease of central nervous system, unspecified: Secondary | ICD-10-CM

## 2024-07-21 NOTE — Progress Notes (Signed)
 1 vial of blood drawn from pts Right FA to be sent off with LP lab work. 1 successful attempt, pt tolerated well. Gauze and tape applied after.

## 2024-07-27 ENCOUNTER — Telehealth: Payer: Self-pay | Admitting: Neurology

## 2024-07-27 NOTE — Telephone Encounter (Signed)
 Dr.Sater patient is requesting results.

## 2024-07-27 NOTE — Telephone Encounter (Signed)
 Pt is calling to get Lumbar Puncture Results the patient states she is having trouble with Mychart , Pt would like call about results .

## 2024-07-28 LAB — VDRL, CSF: VDRL Quant, CSF: NONREACTIVE

## 2024-07-28 LAB — CNS IGG SYNTHESIS RATE, CSF+BLOOD
Albumin Serum: 3.8 g/dL (ref 3.6–5.1)
Albumin, CSF: 14.7 mg/dL (ref 8.0–42.0)
CNS-IgG Synthesis Rate: -3.3 mg/(24.h) (ref <=3.3)
IgG (Immunoglobin G), Serum: 901 mg/dL (ref 600–1640)
IgG Total CSF: 1.6 mg/dL (ref 0.8–7.7)
IgG-Index: 0.46 (ref <0.70)

## 2024-07-28 LAB — CSF CELL COUNT WITH DIFFERENTIAL
RBC Count, CSF: 12 {cells}/uL — ABNORMAL HIGH
TOTAL NUCLEATED CELL: 1 {cells}/uL (ref 0–5)

## 2024-07-28 LAB — PROTEIN, CSF: Total Protein, CSF: 32 mg/dL (ref 15–45)

## 2024-07-28 LAB — OLIGOCLONAL BANDS, CSF + SERM: Oligo Bands: ABSENT

## 2024-07-28 LAB — GLUCOSE, CSF: Glucose, CSF: 63 mg/dL (ref 40–80)

## 2024-07-30 ENCOUNTER — Ambulatory Visit: Payer: Self-pay | Admitting: Neurology

## 2024-07-30 MED ORDER — ACETAZOLAMIDE 250 MG PO TABS: 250.0000 mg | ORAL_TABLET | Freq: Two times a day (BID) | ORAL | 5 refills | Status: DC
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
## 2024-08-03 ENCOUNTER — Telehealth: Payer: Self-pay | Admitting: Neurology

## 2024-08-03 NOTE — Telephone Encounter (Signed)
 Pt called to request  Last results be printed off and  so Pt can pick them up ,r the patient states Mychart keeps kicking her out .   Pt also want to discuss with MD. On What steps she can take to starting back work or going to school  . Or even if Pt can be able to work or go back school  the patient is concern and would like to speak to MD
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
## 2024-08-04 NOTE — Telephone Encounter (Signed)
 Dr. Vear- any recommendations on sx she mentioned below? Pt asking

## 2024-08-05 NOTE — Telephone Encounter (Signed)
 Patient would like call back to discuss back getting stuck. Patient stated, get stuck for hours and in pain.
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
## 2024-08-06 ENCOUNTER — Other Ambulatory Visit: Payer: Self-pay | Admitting: Neurology

## 2024-08-06 DIAGNOSIS — R531 Weakness: Secondary | ICD-10-CM

## 2024-08-06 DIAGNOSIS — M546 Pain in thoracic spine: Secondary | ICD-10-CM

## 2024-08-06 DIAGNOSIS — M542 Cervicalgia: Secondary | ICD-10-CM

## 2024-08-06 DIAGNOSIS — R269 Unspecified abnormalities of gait and mobility: Secondary | ICD-10-CM

## 2024-08-09 NOTE — Telephone Encounter (Signed)
 Noted

## 2024-08-09 NOTE — Telephone Encounter (Signed)
 no auth required sent to GI [phone]

## 2024-08-09 NOTE — Telephone Encounter (Signed)
 Patient returned phone call and relayed message as instructed.

## 2024-08-09 NOTE — Telephone Encounter (Signed)
 Called and lvm 1st attempt. If pt returns call relay the following information:

## 2024-08-12 ENCOUNTER — Telehealth: Payer: Self-pay | Admitting: Neurology

## 2024-08-12 NOTE — Telephone Encounter (Signed)
 Pt is asking to be called to discuss concerns she has had since her LP Wednesday of last week.Pt states on Saturday of last week she had something like a seizure, she also has been experiencing body jerking, please call pt to discuss.
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
## 2024-08-13 NOTE — Telephone Encounter (Signed)
 Pt called wanting to let RN know that she is still having the tremors, jerking and pain that makes it hard to walk even after 9/6.

## 2024-08-13 NOTE — Telephone Encounter (Signed)
 Pt  called back to add  that she is have  a pressure like feeling in her head  like a balloon is inflating and deflating  in her head. Pt states she wants MD to be aware of what's going on .

## 2024-08-17 ENCOUNTER — Ambulatory Visit
Admission: RE | Admit: 2024-08-17 | Discharge: 2024-08-17 | Disposition: A | Discharge status: Home / Self Care | Source: Ambulatory Visit | Attending: Neurology | Admitting: Neurology

## 2024-08-17 DIAGNOSIS — M542 Cervicalgia: Secondary | ICD-10-CM

## 2024-08-17 DIAGNOSIS — R531 Weakness: Secondary | ICD-10-CM

## 2024-08-17 DIAGNOSIS — R269 Unspecified abnormalities of gait and mobility: Secondary | ICD-10-CM

## 2024-08-17 DIAGNOSIS — M546 Pain in thoracic spine: Secondary | ICD-10-CM

## 2024-08-18 ENCOUNTER — Telehealth: Payer: Self-pay | Admitting: Neurology

## 2024-08-18 NOTE — Telephone Encounter (Signed)
 Called pt, unable to leave message. Pt voicemail is full.

## 2024-08-18 NOTE — Telephone Encounter (Signed)
 Pt is asking if before her MRI that was scheduled for yesterday is r/s she would like valium called into  [facility] [phone]  For preparation for the MRI

## 2024-08-19 ENCOUNTER — Telehealth: Payer: Self-pay | Admitting: Physician Assistant

## 2024-08-19 MED ORDER — ALPRAZOLAM 0.5 MG PO TABS: ORAL_TABLET | ORAL | 0 refills | Status: AC

## 2024-08-19 NOTE — Telephone Encounter (Signed)
 Pt c/o medication issue:  1. Name of Medication: diltiazem  (CARDIZEM ) 30 MG tablet [[id number]]   2. How are you currently taking this medication (dosage and times per day)?   3. Are you having a reaction (difficulty breathing--STAT)? Yes   4. What is your medication issue? Pt called and stated she is allergic to this med.  She stated she has taken it before and was allergic to it.  She stated she thinks she is good and dont need it.  She just wanted Renee to know    Best number [phone]

## 2024-08-19 NOTE — Telephone Encounter (Signed)
I have ordered Xanax for patient's upcoming MRI due to anxiety/claustrophobia reported. Please inform patient or caregiver and remind them, that she should not drive after taking Xanax and have someone take her for the MRI appointment.

## 2024-08-19 NOTE — Telephone Encounter (Signed)
 Spoke with pt regarding Diltiazem . Pt stated she has taken the medication before and it caused her to be dizzy and itchy. Pt wanted Charlies Arthur, PA-C to know. Pt also stated her neurologist put her on diamox  which is helping her a lot. Pt was told this information would be shared with Charlies Arthur, PA-C. Pt verbalized understanding. All questions if any were answered.

## 2024-08-20 NOTE — Telephone Encounter (Signed)
 Pt requesting a c/b with a update about the diltiazem  (CARDIZEM ) 30 MG tablet [[id number]]

## 2024-08-20 NOTE — Telephone Encounter (Signed)
 Spoke with pt and let her know that Charlies Arthur, PA has not responded to her question yet but that we would reach out to her once she has. Pt verbalized understanding. All questions if any were answered.

## 2024-08-24 NOTE — Telephone Encounter (Signed)
 Spoke with patient who hasn't took any Ditliazem today and feeling okay. Patient mention already have exzema which ( diltiazem )  can trigger it to be inflamed. Patient she's okay and only take it when she needs it  and for some reason she was feeling different . Patient  will call us  back if any more changes or stop taking as needed.

## 2024-09-02 ENCOUNTER — Ambulatory Visit
Admission: RE | Admit: 2024-09-02 | Discharge: 2024-09-02 | Disposition: A | Discharge status: Home / Self Care | Source: Ambulatory Visit | Attending: Neurology | Admitting: Neurology

## 2024-09-02 ENCOUNTER — Other Ambulatory Visit

## 2024-09-02 DIAGNOSIS — M546 Pain in thoracic spine: Secondary | ICD-10-CM | POA: Diagnosis not present

## 2024-09-02 DIAGNOSIS — R269 Unspecified abnormalities of gait and mobility: Secondary | ICD-10-CM | POA: Diagnosis not present

## 2024-09-02 DIAGNOSIS — R531 Weakness: Secondary | ICD-10-CM | POA: Diagnosis not present

## 2024-09-02 DIAGNOSIS — M542 Cervicalgia: Secondary | ICD-10-CM | POA: Diagnosis not present

## 2024-09-02 MED ORDER — GADOPICLENOL 0.5 MMOL/ML IV SOLN
10.0000 mL | Freq: Once | INTRAVENOUS | Status: AC | PRN
  Administered 2024-09-02: 10 mL via INTRAVENOUS

## 2024-09-06 ENCOUNTER — Ambulatory Visit: Payer: Self-pay | Admitting: Neurology

## 2024-09-06 DIAGNOSIS — Z79899 Other long term (current) drug therapy: Secondary | ICD-10-CM

## 2024-09-06 DIAGNOSIS — G35A Relapsing-remitting multiple sclerosis: Secondary | ICD-10-CM

## 2024-09-06 MED ORDER — BACLOFEN 10 MG PO TABS: ORAL_TABLET | ORAL | 0 refills | Status: DC

## 2024-09-06 MED ORDER — TERIFLUNOMIDE 14 MG PO TABS: ORAL_TABLET | ORAL | 11 refills | Status: DC

## 2024-09-06 NOTE — Telephone Encounter (Signed)
 I discussed the results of the MRIs: She has 3 foci in the spinal cord and multiple foci in the brain.  None of the foci enhanced.  They are consistent with chronic demyelination associated with multiple sclerosis.  The lumbar puncture was normal.  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  Therefore, I would like her to start teriflunomide .  I also discussed that we will check liver function test monthly for 5 to 6 months.  She continues to experience some spasms in her side.  I discussed that we can do a trial of baclofen to see if that helps.  She has an appointment to see me back in February.  IMPRESSION: This MRI of the cervical spine with and without contrast shows the following: There is nonenhancing T2 hyperintense focus posteriorly within the spinal cord adjacent to C3 associated with mild myelomalacia.  The appearance is consistent with chronic demyelination.  It was also present on a previous MRI. Degenerative changes are noted at C3-C4, C4-C5 and C5-C6.  This causes mild to moderate foraminal narrowing to the left at C4-C5 and C5-C6 level though there is no spinal stenosis or nerve root compression. Normal enhancement pattern.   IMPRESSION: This MRI of the thoracic spine with and without contrast shows the following: T2 hyperintense focus within the central spinal cord adjacent to T3 and possibly a second focus centrally adjacent to T5.  These are consistent with chronic demyelination.  They do not enhance. No significant degenerative changes noted in the thoracic spine Normal enhancement pattern.

## 2024-09-08 ENCOUNTER — Telehealth: Payer: Self-pay | Admitting: *Deleted

## 2024-09-08 NOTE — Telephone Encounter (Addendum)
 See phone note 09/06/24. Dr. Vear discussed w/ pt about starting teriflunomide  and [facility]   Per [facility] today/phone room: [facility] is asking to speak with RN re: pt's Teriflunomide  14 MG TABS Needs ICD 10 and PA   Dr. Vear- can you add MS dx/ICD 10 to chart?   Josette- can you assist with PA? Thank you

## 2024-09-09 ENCOUNTER — Other Ambulatory Visit: Payer: Self-pay | Admitting: Neurology

## 2024-09-09 NOTE — Telephone Encounter (Signed)
 Initiated PA for teriflunomide . Sent to [facility] via CMM. Waiting on clinical question set. Key: [id number].

## 2024-09-09 NOTE — Telephone Encounter (Signed)
 Noted

## 2024-09-09 NOTE — Telephone Encounter (Signed)
 PA for teriflunomide  completed via CMM and sent to [facility]. Key: [id number]. Should have a determination within 1-3 business days.

## 2024-09-09 NOTE — Telephone Encounter (Signed)
 Pharmacy/patient requesting change to 90 days rx.  Please review, thanks!

## 2024-09-13 NOTE — Telephone Encounter (Signed)
 Patient said, prescription for Teriflunomide  14 MG TABS would cost $200. Can not afford that, could Dr. Vear prescribe the generic brand. Would like a call back.

## 2024-09-23 NOTE — Telephone Encounter (Signed)
 I called patient to discuss if she has received her teriflunomide .  She was having trouble getting her account to set up with [facility].  I am unsure how to help her with that.  No answer, left a voicemail asking her to call us  back

## 2024-09-27 ENCOUNTER — Telehealth: Payer: Self-pay | Admitting: Neurology

## 2024-09-27 ENCOUNTER — Other Ambulatory Visit: Payer: Self-pay | Admitting: Neurology

## 2024-09-27 MED ORDER — TOPIRAMATE 50 MG PO TABS: 50.0000 mg | ORAL_TABLET | Freq: Two times a day (BID) | ORAL | 5 refills | Status: DC

## 2024-09-27 NOTE — Telephone Encounter (Signed)
 This encounter was created in error - please disregard.

## 2024-09-27 NOTE — Telephone Encounter (Signed)
 Pt is asking if she can come off of acetaZOLAMIDE  (DIAMOX ) 250 MG tablet as a result of it making her feel Nauseated, muscle fatigue,   she wakes up in the middle of the night with her chest hurting, she feels it is causing her vertigo, causing her to be imbalance and she states her balance is worse, please call to discuss.  Pt had a major fall out of a back door which caused her major bruses, please call.

## 2024-09-27 NOTE — Telephone Encounter (Signed)
 Call to patient, she reports that since taking the diamox  she is having dizziness and unsteady on her feet. She also reports chest pain and nauseated. She also reports taste of foods different. She has reported a major fall since taking the diamox . She doesn't report the diamox  being helpful with headaches. She reports having an eye dr appt on 09/01/24. Advised I will send to Dr. Vear for review.

## 2024-09-28 NOTE — Telephone Encounter (Signed)
 Call to patient, no answer. Left message to call back.

## 2024-09-28 NOTE — Telephone Encounter (Signed)
 Cal to patient, reviewed instructions and medications. She verbalized understanding.

## 2024-09-28 NOTE — Telephone Encounter (Signed)
 Pt called to request for Nurse to give Pt a call the patient states she would like to discuss over phone Because she is noy understanding  Mycahrt message

## 2024-09-28 NOTE — Telephone Encounter (Signed)
 Pt called back informed she will get call back

## 2024-09-28 NOTE — Telephone Encounter (Signed)
 Pt called returning call , Informed Nurse will call back

## 2024-10-06 NOTE — Telephone Encounter (Signed)
 I called patient to discuss if she has received her teriflunomide .  She reports that she would prefer to wait until February at her appointment with Dr. Vear to discuss if she actually has MS and therefore should start teriflunomide .  She will hold off on ordering the teriflunomide  at this time.  She reports that since she has started the topiramate she has noticed increased body odor.  She has to shower more frequently.  Also of note, she fell out of her back door a few weeks ago.  She has injured her ankles.  She is less mobile than she was before her injury.  She wanted me to let Dr. Vear know and is wondering if the topiramate could be causing the increased body odor.  I advised her I do not think so but I would run this by Dr. Vear.  Patient verbalized understanding and appreciation.

## 2024-10-07 ENCOUNTER — Other Ambulatory Visit: Payer: Self-pay | Admitting: *Deleted

## 2024-10-07 DIAGNOSIS — G35A Relapsing-remitting multiple sclerosis: Secondary | ICD-10-CM

## 2024-10-07 MED ORDER — TERIFLUNOMIDE 14 MG PO TABS: 14.0000 mg | ORAL_TABLET | Freq: Every day | ORAL | 11 refills | Status: DC

## 2024-10-07 MED ORDER — TERIFLUNOMIDE 14 MG PO TABS
14.0000 mg | ORAL_TABLET | Freq: Every day | ORAL | 11 refills | Status: AC
Start: 2024-10-07 — End: ?

## 2024-10-07 MED ORDER — TERIFLUNOMIDE 14 MG PO TABS: ORAL_TABLET | ORAL | 11 refills | Status: DC

## 2024-10-07 NOTE — Addendum Note (Signed)
 Addended byBETHA MOLT, MAURILIO L on: 10/07/2024 12:12 PM   Modules accepted: Orders

## 2024-10-07 NOTE — Telephone Encounter (Signed)
 I called pt and relayed that per Dr. Vear : The Topamax dose is low-dose I do not think it would cause any change in body odor.  However, because Topamax can cause reduce sweating it is possibly related.  Pt verbalized understanding.  She then asked about her medication, teriflunomide  what to do.  I relayed her last conversation with Josette and pt was to wait until February when she came into appt.  I relayed results of her MRI's and LP, and per Dr. Vear is noted that she has mild MS and would start the Teriflunomide  14mg  po daily.  She can get thru [facility].  I gave her the #.  The prescription was sent.  I called to confirm and they did not receive.  [facility]  The order  does not appear to go thru, message pops up about awaiting authorization.  May have to call and cancel to get the prescription to go thru to other pharmacy.  Pt will need to sign up via by phone and they can walk pt thru it option 3 then option 6 (will need device,   Or go online  at [email or web address].

## 2024-10-07 NOTE — Telephone Encounter (Signed)
 I called pt and I relayed script went thru, she can sign up on line or call speak to CSR .  She appreciated call back.  Appreciated the call back.  She will call back if questions or concerns.

## 2024-10-15 ENCOUNTER — Other Ambulatory Visit: Payer: Self-pay | Admitting: Neurology

## 2024-10-18 ENCOUNTER — Telehealth: Payer: Self-pay | Admitting: Neurology

## 2024-10-18 ENCOUNTER — Other Ambulatory Visit: Payer: Self-pay | Admitting: Neurology

## 2024-10-18 MED ORDER — FUROSEMIDE 20 MG PO TABS
20.0000 mg | ORAL_TABLET | Freq: Every morning | ORAL | 5 refills | Status: AC
Start: 2024-10-18 — End: ?

## 2024-10-18 NOTE — Telephone Encounter (Signed)
 Pt called to request to speak to Md about changing medication s .Pt stated  that medication is giving her really bad side effects the patient stated that  medication  giving her blisters and her hands are red . Pt also stated that she  has a lot of confusions  and feel  Depressed . Pt is concern about medication and would like to change it

## 2024-10-18 NOTE — Telephone Encounter (Signed)
 Last seen on 07/14/24 Follow up scheduled 01/25/25

## 2024-10-18 NOTE — Telephone Encounter (Signed)
 Call to patient, she reports both hands being red and inflammed up to her elbows. She reports it looks like she has been scolded hot. She reports it starting about 1 week ago and he jerking has gotten worse. Se also reports brain fog. All this has happened since starting the topamax. She was having confusion when I was talking with her and couldn't confirm it she was taking it as prescribed. She wants to stop the medication and see if there are other options.

## 2024-10-19 NOTE — Telephone Encounter (Signed)
 Call to patient, advised of new medication lasix called in and could stop the topamax. Patient verbalized understanding.

## 2024-10-25 ENCOUNTER — Telehealth: Payer: Self-pay | Admitting: Neurology

## 2024-10-25 NOTE — Telephone Encounter (Signed)
 Pt called stating that she might have some new Symptom.Pt stated that she is having some weried Symptoms in her back . It feels like a spasm but Pt stated  its really painfully . Pt also stated that  when she is standing she feeling like she sways back and forward with out even moving . Pt  wants to know if this apart of MS. Also Pt wanted to know if there a was to stay fit with MS . Pt would like to speak to Nurse about Symptoms

## 2024-10-25 NOTE — Telephone Encounter (Signed)
 I called pt and she has noted some weird symptom (heaviness , throbbing, electric impulses) in her vagina (deep). She is asking if related to MS. No other changes from her usual symptoms.  She thought at first might be progesterone??   No other changes in medications.  She asked about staying fit.  I told her yes important for all of us , do what she feels that she can do safely. I would respond back to her via mychart.  She is on lasix  now (off topamax ).

## 2024-12-26 ENCOUNTER — Other Ambulatory Visit: Payer: Self-pay | Admitting: Neurology

## 2024-12-28 NOTE — Telephone Encounter (Signed)
 Last seen on 07/14/24 Follow up scheduled on 01/25/25  Did you want to continue Rx or take OTC?

## 2025-01-25 ENCOUNTER — Ambulatory Visit: Admitting: Neurology
